# Patient Record
Sex: Male | Born: 1995 | Race: White | Hispanic: No | Marital: Married | Smoking: Current every day smoker
Health system: Southern US, Community
[De-identification: ages and names within clinical notes are randomized; demographics above are authoritative.]

## PROBLEM LIST (undated history)

## (undated) DIAGNOSIS — M25569 Pain in unspecified knee: Secondary | ICD-10-CM

## (undated) DIAGNOSIS — B009 Herpesviral infection, unspecified: Secondary | ICD-10-CM

## (undated) DIAGNOSIS — F419 Anxiety disorder, unspecified: Secondary | ICD-10-CM

## (undated) HISTORY — DX: Herpesviral infection, unspecified: B00.9

---
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
## 2016-01-25 ENCOUNTER — Other Ambulatory Visit (HOSPITAL_COMMUNITY): Payer: Self-pay | Admitting: Ophthalmology

## 2016-01-25 DIAGNOSIS — M2391 Unspecified internal derangement of right knee: Secondary | ICD-10-CM

## 2016-01-29 ENCOUNTER — Ambulatory Visit (HOSPITAL_COMMUNITY)
Admission: RE | Admit: 2016-01-29 | Discharge: 2016-01-29 | Disposition: A | Discharge status: Home / Self Care | Payer: 59 | Source: Ambulatory Visit | Attending: Ophthalmology | Admitting: Ophthalmology

## 2016-01-29 DIAGNOSIS — M2391 Unspecified internal derangement of right knee: Secondary | ICD-10-CM

## 2016-06-02 ENCOUNTER — Encounter (HOSPITAL_COMMUNITY): Payer: Self-pay

## 2016-06-02 ENCOUNTER — Emergency Department (HOSPITAL_COMMUNITY)
Admission: EM | Admit: 2016-06-02 | Discharge: 2016-06-02 | Disposition: A | Discharge status: Home / Self Care | Payer: 59 | Attending: Emergency Medicine | Admitting: Emergency Medicine

## 2016-06-02 ENCOUNTER — Emergency Department (HOSPITAL_COMMUNITY): Payer: 59

## 2016-06-02 DIAGNOSIS — Y939 Activity, unspecified: Secondary | ICD-10-CM | POA: Insufficient documentation

## 2016-06-02 DIAGNOSIS — Y999 Unspecified external cause status: Secondary | ICD-10-CM | POA: Diagnosis not present

## 2016-06-02 DIAGNOSIS — S63501A Unspecified sprain of right wrist, initial encounter: Secondary | ICD-10-CM | POA: Diagnosis not present

## 2016-06-02 DIAGNOSIS — Y929 Unspecified place or not applicable: Secondary | ICD-10-CM | POA: Diagnosis not present

## 2016-06-02 DIAGNOSIS — W000XXA Fall on same level due to ice and snow, initial encounter: Secondary | ICD-10-CM | POA: Insufficient documentation

## 2016-06-02 DIAGNOSIS — S6991XA Unspecified injury of right wrist, hand and finger(s), initial encounter: Secondary | ICD-10-CM | POA: Diagnosis present

## 2016-06-02 MED ORDER — HYDROCODONE-ACETAMINOPHEN 5-325 MG PO TABS
1.0000 | ORAL_TABLET | Freq: Once | ORAL | Status: AC
  Administered 2016-06-02: 1 via ORAL
  Filled 2016-06-02: qty 1

## 2016-06-02 NOTE — ED Triage Notes (Signed)
Patient fell on ice this am, and caught himself withhis right hand. Swelling and pain since. Reports difficulty moving right thumb with discomfort of right thumb and index finger. Sensation intact

## 2016-06-02 NOTE — Discharge Instructions (Signed)
Elevate and apply ice packs on/off to your wrist.  Follow-up with the orthopedic doctor listed in one week if not improving.  Take ibuprofen 600 mg 3 times a day.

## 2016-06-02 NOTE — ED Provider Notes (Signed)
[facility] Provider Note   CSN: [id number] Arrival date & time: 06/02/16  2114     History   Chief Complaint Chief Complaint  Patient presents with  . Fall    HPI Gregory Fleming is a 21 y.o. male.  HPI   Gregory Fleming is a 21 y.o. male who presents to the Emergency Department complaining of Right thumb and wrist pain secondary to a fall that occurred earlier today. He states he slipped on some ice and fell on his right hand. He reports some swelling of his proximal thumb. Pain worse with movement of the thumb and right index finger. He is taking Tylenol and ibuprofen earlier today with out significant relief. He denies open wound, numbness of the hand or fingers, or pain proximal to the wrist. He denies other injuries as well.  Past Medical History:  Diagnosis Date  . Anxiety   . Knee pain     There are no active problems to display for this patient.   History reviewed. No pertinent surgical history.     Home Medications    Prior to Admission medications   Medication Sig Start Date End Date Taking? Authorizing Provider  ibuprofen (ADVIL,MOTRIN) 600 MG tablet Take 1 tablet (600 mg total) by mouth every 6 (six) hours as needed. 12/05/15   Ivery Quale, PA-C  ondansetron (ZOFRAN-ODT) 8 MG disintegrating tablet Take 1 tablet (8 mg total) by mouth every 8 (eight) hours as needed for nausea or vomiting. 08/06/14   Benjiman Core, MD  traMADol (ULTRAM) 50 MG tablet Take 1 tablet (50 mg total) by mouth every 6 (six) hours as needed. 12/05/15   Ivery Quale, PA-C    Family History History reviewed. No pertinent family history.  Social History Social History  Substance Use Topics  . Smoking status: Never Smoker  . Smokeless tobacco: Never Used  . Alcohol use No     Allergies   Patient has no known allergies.   Review of Systems Review of Systems  Constitutional: Negative for chills and fever.  Musculoskeletal: Positive for arthralgias (Right  thumb, index finger and wrist pain) and joint swelling.  Skin: Negative for color change and wound.  Neurological: Negative for dizziness, weakness and numbness.  All other systems reviewed and are negative.    Physical Exam Updated Vital Signs BP 152/75 (BP Location: Right Arm)   Pulse 107   Temp 97.9 F (36.6 C) (Oral)   Resp 16   Ht 5\' 11"  (1.803 m)   Wt 122.5 kg   SpO2 97%   BMI 37.66 kg/m   Physical Exam  Constitutional: He is oriented to person, place, and time. He appears well-developed and well-nourished. No distress.  HENT:  Head: Normocephalic and atraumatic.  Cardiovascular: Normal rate, regular rhythm and normal heart sounds.   Pulmonary/Chest: Effort normal and breath sounds normal.  Musculoskeletal: He exhibits tenderness. He exhibits no edema.       Right hand: He exhibits tenderness and swelling. He exhibits no bony tenderness, normal capillary refill, no deformity and no laceration. Normal sensation noted. He exhibits no finger abduction, no thumb/finger opposition and no wrist extension trouble.       Hands: Tenderness to palpation of the right wrist, proximal right thumb and proximal right index finger. Radial pulse is brisk, distal sensation intact.  CR< 2 sec.  No bruising or bony deformity.    Neurological: He is alert and oriented to person, place, and time. He exhibits normal muscle tone. Coordination  normal.  Skin: Skin is warm and dry.  Nursing note and vitals reviewed.    ED Treatments / Results  Labs (all labs ordered are listed, but only abnormal results are displayed) Labs Reviewed - No data to display  EKG  EKG Interpretation None       Radiology Dg Wrist Complete Right  Result Date: 06/02/2016 CLINICAL DATA:  Right wrist pain along the radial side after fall on ice. EXAM: RIGHT WRIST - COMPLETE 3+ VIEW COMPARISON:  None. FINDINGS: There is no evidence of fracture or dislocation. There is ulnar minus variance with remodeled appearance  of the distal radial metaphysis at the distal radioulnar joint from resultant ulnar impingement. Carpal bones are maintained. Scaphoid appears intact. Mild soft tissue swelling about the wrist. IMPRESSION: No acute osseous abnormality. Ulnar minus variance with remodeled appearance of the distal radius attributable to ulnar impingement. Electronically Signed   By: Tollie Ethavid  Kwon M.D.   On: 06/02/2016 22:55   Dg Hand Complete Right  Result Date: 06/02/2016 CLINICAL DATA:  Right hand can't wrist pain along the radial side EXAM: RIGHT HAND - COMPLETE 3+ VIEW COMPARISON:  None. FINDINGS: There is no evidence of fracture or dislocation. There is no evidence of arthropathy or other focal bone abnormality. Soft tissues are unremarkable. IMPRESSION: Negative. Electronically Signed   By: Tollie Ethavid  Kwon M.D.   On: 06/02/2016 22:52    Procedures Procedures (including critical care time)  Medications Ordered in ED Medications  HYDROcodone-acetaminophen (NORCO/VICODIN) 5-325 MG per tablet 1 tablet (not administered)     Initial Impression / Assessment and Plan / ED Course  I have reviewed the triage vital signs and the nursing notes.  Pertinent labs & imaging results that were available during my care of the patient were reviewed by me and considered in my medical decision making (see chart for details).     Patient well-appearing. X-rays negative for fracture. Likely sprain. Remains neurovascularly intact.  Wrist splint applied, pain improved, patient agrees to treatment plan with NSAID, ice, and close orthopedic follow-up if needed.   Final Clinical Impressions(s) / ED Diagnoses   Final diagnoses:  Sprain of right wrist, initial encounter    New Prescriptions New Prescriptions   No medications on file     Rosey Bathammy Keera Altidor, PA-C 06/04/16 1251    Donnetta HutchingBrian Cook, MD 06/05/16 1129

## 2016-06-06 ENCOUNTER — Ambulatory Visit: Payer: 59 | Admitting: Orthopaedic Surgery

## 2016-11-14 ENCOUNTER — Emergency Department (HOSPITAL_COMMUNITY)
Admission: EM | Admit: 2016-11-14 | Discharge: 2016-11-15 | Disposition: A | Discharge status: Home / Self Care | Payer: No Typology Code available for payment source | Attending: Emergency Medicine | Admitting: Emergency Medicine

## 2016-11-14 ENCOUNTER — Emergency Department (HOSPITAL_COMMUNITY): Payer: No Typology Code available for payment source

## 2016-11-14 ENCOUNTER — Encounter (HOSPITAL_COMMUNITY): Payer: Self-pay | Admitting: Emergency Medicine

## 2016-11-14 DIAGNOSIS — Y999 Unspecified external cause status: Secondary | ICD-10-CM | POA: Insufficient documentation

## 2016-11-14 DIAGNOSIS — Y92410 Unspecified street and highway as the place of occurrence of the external cause: Secondary | ICD-10-CM | POA: Diagnosis not present

## 2016-11-14 DIAGNOSIS — Y939 Activity, unspecified: Secondary | ICD-10-CM | POA: Insufficient documentation

## 2016-11-14 DIAGNOSIS — R0789 Other chest pain: Secondary | ICD-10-CM | POA: Diagnosis not present

## 2016-11-14 NOTE — Discharge Instructions (Signed)
Alternate 600 mg of ibuprofen and [phone] mg of Tylenol every 3 hours as needed for pain. Do not exceed 4000 mg of Tylenol daily. Apply ice or heat to the effected area for comfort. Do some gentle stretching during hot showers or baths. Return to the ED if any concerning signs or symptoms develop such as fever, chills, shortness of breath, worsening chest pain, or altered mental status. Follow-up with her primary care physician in 1-2 weeks for reevaluation if symptoms persist.
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
## 2017-01-03 ENCOUNTER — Encounter (HOSPITAL_COMMUNITY): Payer: Self-pay | Admitting: Emergency Medicine

## 2017-01-03 ENCOUNTER — Emergency Department (HOSPITAL_COMMUNITY)
Admission: EM | Admit: 2017-01-03 | Discharge: 2017-01-03 | Disposition: A | Discharge status: Home / Self Care | Payer: Self-pay | Attending: Emergency Medicine | Admitting: Emergency Medicine

## 2017-01-03 ENCOUNTER — Emergency Department (HOSPITAL_COMMUNITY): Payer: Self-pay

## 2017-01-03 DIAGNOSIS — J329 Chronic sinusitis, unspecified: Secondary | ICD-10-CM | POA: Insufficient documentation

## 2017-01-03 DIAGNOSIS — R05 Cough: Secondary | ICD-10-CM | POA: Insufficient documentation

## 2017-01-03 DIAGNOSIS — F17210 Nicotine dependence, cigarettes, uncomplicated: Secondary | ICD-10-CM | POA: Insufficient documentation

## 2017-01-03 DIAGNOSIS — J029 Acute pharyngitis, unspecified: Secondary | ICD-10-CM | POA: Insufficient documentation

## 2017-01-03 LAB — RAPID STREP SCREEN (MED CTR MEBANE ONLY): STREPTOCOCCUS, GROUP A SCREEN (DIRECT): NEGATIVE

## 2017-01-03 MED ORDER — NAPROXEN 250 MG PO TABS
250.0000 mg | ORAL_TABLET | Freq: Two times a day (BID) | ORAL | 0 refills | Status: DC | PRN
Start: 2017-01-03 — End: 2018-11-19

## 2017-01-03 MED ORDER — ACETAMINOPHEN 325 MG PO TABS
650.0000 mg | ORAL_TABLET | Freq: Once | ORAL | Status: AC
  Administered 2017-01-03: 650 mg via ORAL
  Filled 2017-01-03: qty 2

## 2017-01-03 MED ORDER — IBUPROFEN 400 MG PO TABS
400.0000 mg | ORAL_TABLET | Freq: Once | ORAL | Status: AC
  Administered 2017-01-03: 400 mg via ORAL
  Filled 2017-01-03: qty 1

## 2017-01-03 MED ORDER — IPRATROPIUM-ALBUTEROL 0.5-2.5 (3) MG/3ML IN SOLN
3.0000 mL | Freq: Once | RESPIRATORY_TRACT | Status: AC
  Administered 2017-01-03: 3 mL via RESPIRATORY_TRACT
  Filled 2017-01-03: qty 3

## 2017-01-03 MED ORDER — AZITHROMYCIN 250 MG PO TABS
ORAL_TABLET | ORAL | 0 refills | Status: DC
Start: 2017-01-03 — End: 2018-11-19

## 2017-01-03 MED ORDER — FLUTICASONE PROPIONATE 50 MCG/ACT NA SUSP
2.0000 | Freq: Every day | NASAL | 0 refills | Status: DC
Start: 2017-01-03 — End: 2018-11-19

## 2017-01-03 NOTE — Discharge Instructions (Signed)
Take over the counter decongestant (such as sudafed), as directed on packaging, for the next week.  Use over the counter normal saline nasal spray, as instructed in the Emergency Department, several times per day for the next 2 weeks.  Take the prescriptions as directed. Call your regular medical doctor on Monday to schedule a follow up appointment this week.  Return to the Emergency Department immediately if worsening.

## 2017-01-03 NOTE — ED Notes (Signed)
Facial redness and pressure to sinus region.

## 2017-01-03 NOTE — ED Notes (Signed)
Pt upset stating that he didn't get any help while he was here. Pt made aware of all treatment that was given to him while he was here. When this RN asked pt what he felt like we didn't help him with he said, "my teeth still hurt." "They've been like this for 4 days now." This RN advised pt he needs to complete the antibiotic fully, use nasal spray, decongestant, and tylenol and/or ibuprofen for pain and fever. Pt also made aware to follow up with his PCP this week. Pt verbalized understanding as well as pt's wife.

## 2017-01-03 NOTE — ED Notes (Signed)
Alerted MD about pt.'s throbbing pain .

## 2017-01-03 NOTE — ED Provider Notes (Signed)
[facility]-EMERGENCY DEPT Provider Note   CSN: [id number] Arrival date & time: 01/03/17  2138     History   Chief Complaint Chief Complaint  Patient presents with  . Facial Pain    x3 days    HPI Gregory Fleming is a 21 y.o. male.  HPI  Pt was seen at 2155.  Per pt and his wife, c/o gradual onset and persistence of constant sore throat, runny/stuffy nose, sinus congestion, and cough for the past 3 to 4 days.  Pt states his wife was recently treated for pneumonia. Denies fevers, no rash, no CP/SOB, no N/V/D, no abd pain.    Past Medical History:  Diagnosis Date  . Anxiety   . Knee pain     There are no active problems to display for this patient.   History reviewed. No pertinent surgical history.     Home Medications    Prior to Admission medications   Not on File    Family History No family history on file.  Social History Social History  Substance Use Topics  . Smoking status: Current Every Day Smoker    Packs/day: 0.20    Types: Cigarettes  . Smokeless tobacco: Never Used  . Alcohol use No     Allergies   Patient has no known allergies.   Review of Systems Review of Systems ROS: Statement: All systems negative except as marked or noted in the HPI; Constitutional: Negative for fever and chills. ; ; Eyes: Negative for eye pain, redness and discharge. ; ; ENMT: Negative for ear pain, hoarseness, +nasal congestion, sinus pressure and sore throat. ; ; Cardiovascular: Negative for chest pain, palpitations, diaphoresis, dyspnea and peripheral edema. ; ; Respiratory: +cough. Negative for wheezing and stridor. ; ; Gastrointestinal: Negative for nausea, vomiting, diarrhea, abdominal pain, blood in stool, hematemesis, jaundice and rectal bleeding. . ; ; Genitourinary: Negative for dysuria, flank pain and hematuria. ; ; Musculoskeletal: Negative for back pain and neck pain. Negative for swelling and trauma.; ; Skin: Negative for pruritus, rash, abrasions, blisters,  bruising and skin lesion.; ; Neuro: Negative for headache, lightheadedness and neck stiffness. Negative for weakness, altered level of consciousness, altered mental status, extremity weakness, paresthesias, involuntary movement, seizure and syncope.       Physical Exam Updated Vital Signs BP (!) 155/89 (BP Location: Right Arm)   Pulse (!) 116   Temp (!) 100.8 F (38.2 C) (Tympanic)   Ht 5\' 11"  (1.803 m)   Wt 127 kg (280 lb)   SpO2 96%   BMI 39.05 kg/m   Physical Exam 2200: Physical examination:  Nursing notes reviewed; Vital signs and O2 SAT reviewed;  Constitutional: Well developed, Well nourished, Well hydrated, In no acute distress; Head:  Normocephalic, atraumatic; Eyes: EOMI, PERRL, No scleral icterus; ENMT: TM's clear bilat. +edemetous nasal turbinates bilat with clear rhinorrhea. Mouth and pharynx without lesions. No tonsillar exudates. No intra-oral edema. No submandibular or sublingual edema. No hoarse voice, no drooling, no stridor. No pain with manipulation of larynx. No trismus. Mouth and pharynx normal, Mucous membranes moist; Neck: Supple, Full range of motion, No lymphadenopathy. No meningeal signs; Cardiovascular: Regular rate and rhythm, No gallop; Respiratory: Breath sounds clear & equal bilaterally, No wheezes.  Speaking full sentences with ease, Normal respiratory effort/excursion; Chest: Nontender, Movement normal; Abdomen: Soft, Nontender, Nondistended, Normal bowel sounds; Genitourinary: No CVA tenderness; Extremities: Pulses normal, No tenderness, No edema, No calf edema or asymmetry.; Neuro: AA&Ox3, Major CN grossly intact.  Speech clear. No gross  focal motor or sensory deficits in extremities.; Skin: Color normal, Warm, Dry.   ED Treatments / Results  Labs (all labs ordered are listed, but only abnormal results are displayed)   EKG  EKG Interpretation None       Radiology   Procedures Procedures (including critical care time)  Medications Ordered in  ED Medications  ipratropium-albuterol (DUONEB) 0.5-2.5 (3) MG/3ML nebulizer solution 3 mL (3 mLs Nebulization Given 01/03/17 2213)  acetaminophen (TYLENOL) tablet 650 mg (650 mg Oral Given 01/03/17 2208)  ibuprofen (ADVIL,MOTRIN) tablet 400 mg (400 mg Oral Given 01/03/17 2210)     Initial Impression / Assessment and Plan / ED Course  I have reviewed the triage vital signs and the nursing notes.  Pertinent labs & imaging results that were available during my care of the patient were reviewed by me and considered in my medical decision making (see chart for details).  MDM Reviewed: previous chart, nursing note and vitals Interpretation: x-ray and labs   Results for orders placed or performed during the hospital encounter of 01/03/17  Rapid strep screen  Result Value Ref Range   Streptococcus, Group A Screen (Direct) NEGATIVE NEGATIVE   Dg Chest 2 View Result Date: 01/03/2017 CLINICAL DATA:  Productive cough and shortness of breath. Central chest pain. EXAM: CHEST  2 VIEW COMPARISON:  Radiographs 11/14/2016 FINDINGS: The cardiomediastinal contours are normal. Bronchial thickening. Pulmonary vasculature is normal. No consolidation, pleural effusion, or pneumothorax. No acute osseous abnormalities are seen. IMPRESSION: Bronchial thickening without pneumonia. Electronically Signed   By: Rubye Oaks M.D.   On: 01/03/2017 22:52    2300:  Workup reassuring. Dx and testing d/w pt and family.  Questions answered.  Verb understanding, agreeable to d/c home with outpt f/u.    Final Clinical Impressions(s) / ED Diagnoses   Final diagnoses:  None    New Prescriptions New Prescriptions   No medications on file     Samuel Jester, DO 01/04/17 2342

## 2017-01-03 NOTE — ED Triage Notes (Signed)
Sick x 1 week Wife released with pneumonia from hospital

## 2017-01-06 LAB — CULTURE, GROUP A STREP (THRC)

## 2018-10-21 ENCOUNTER — Ambulatory Visit (HOSPITAL_COMMUNITY)
Admission: RE | Admit: 2018-10-21 | Discharge: 2018-10-21 | Disposition: A | Discharge status: Home / Self Care | Payer: Self-pay | Source: Ambulatory Visit | Attending: Preventative Medicine | Admitting: Preventative Medicine

## 2018-10-21 ENCOUNTER — Other Ambulatory Visit (HOSPITAL_COMMUNITY): Payer: Self-pay | Admitting: Preventative Medicine

## 2018-10-21 ENCOUNTER — Other Ambulatory Visit: Payer: Self-pay | Admitting: Preventative Medicine

## 2018-10-21 ENCOUNTER — Other Ambulatory Visit: Payer: Self-pay

## 2018-10-21 DIAGNOSIS — K429 Umbilical hernia without obstruction or gangrene: Secondary | ICD-10-CM

## 2018-10-21 MED ORDER — IOHEXOL 300 MG/ML  SOLN
100.0000 mL | Freq: Once | INTRAMUSCULAR | Status: AC | PRN
  Administered 2018-10-21: 16:00:00 100 mL via INTRAVENOUS

## 2018-10-21 MED ORDER — IOHEXOL 300 MG/ML  SOLN
30.0000 mL | Freq: Once | INTRAMUSCULAR | Status: AC | PRN
  Administered 2018-10-21: 16:00:00 30 mL via ORAL

## 2018-11-18 ENCOUNTER — Emergency Department (HOSPITAL_COMMUNITY)
Admission: EM | Admit: 2018-11-18 | Discharge: 2018-11-18 | Disposition: A | Discharge status: Home / Self Care | Payer: Self-pay | Attending: Emergency Medicine | Admitting: Emergency Medicine

## 2018-11-18 ENCOUNTER — Other Ambulatory Visit: Payer: Self-pay

## 2018-11-18 ENCOUNTER — Encounter (HOSPITAL_COMMUNITY): Payer: Self-pay | Admitting: Emergency Medicine

## 2018-11-18 DIAGNOSIS — K429 Umbilical hernia without obstruction or gangrene: Secondary | ICD-10-CM | POA: Insufficient documentation

## 2018-11-18 DIAGNOSIS — F17210 Nicotine dependence, cigarettes, uncomplicated: Secondary | ICD-10-CM | POA: Insufficient documentation

## 2018-11-18 NOTE — ED Triage Notes (Signed)
Pt saw urgent care three weeks ago and sent here for CT scan scan, dx with umbilical hernia and told to f/u with surgeon, pt says pain went away on its own then came back two days ago and he is here with intention of getting a referral to surgeon.

## 2018-11-18 NOTE — ED Provider Notes (Signed)
[facility] EMERGENCY DEPARTMENT Provider Note   CSN: [id number] Arrival date & time: 11/18/18  1909     History   Chief Complaint Chief Complaint  Patient presents with  . Umbilical Hernia    HPI Gregory Fleming is a 23 y.o. male.     Persistent periumbilical pain for greater than 1 month.  s/p CT scan on 10/21/2018 which revealed a fat-containing umbilical hernia.  He is eating without vomiting.  Review of systems positive for diarrhea.  Pain is affecting his ability to do his job.  Has not yet seen a Education officer, environmental.  Nothing makes symptoms better or worse.     Past Medical History:  Diagnosis Date  . Anxiety   . Knee pain     There are no active problems to display for this patient.   History reviewed. No pertinent surgical history.      Home Medications    Prior to Admission medications   Medication Sig Start Date End Date Taking? Authorizing Provider  azithromycin (ZITHROMAX) 250 MG tablet Take 2 tablets PO day 1, then 1 tab PO daily x4 days. 01/03/17   Francine Graven, DO  fluticasone (FLONASE) 50 MCG/ACT nasal spray Place 2 sprays into both nostrils daily. For the next 2 weeks 01/03/17   Francine Graven, DO  naproxen (NAPROSYN) 250 MG tablet Take 1 tablet (250 mg total) by mouth 2 (two) times daily as needed for mild pain or moderate pain (take with food). 01/03/17   Francine Graven, DO    Family History No family history on file.  Social History Social History   Tobacco Use  . Smoking status: Current Every Day Smoker    Packs/day: 0.20    Types: Cigarettes  . Smokeless tobacco: Never Used  Substance Use Topics  . Alcohol use: Yes    Comment: occ  . Drug use: No     Allergies   Patient has no known allergies.   Review of Systems Review of Systems  All other systems reviewed and are negative.    Physical Exam Updated Vital Signs BP 134/83 (BP Location: Right Arm)   Pulse 94   Temp 98.7 F (37.1 C) (Oral)   Resp 17   Ht 5\' 7"   (1.702 m)   Wt 122.5 kg   SpO2 97%   BMI 42.29 kg/m   Physical Exam Vitals signs and nursing note reviewed.  Constitutional:      Appearance: He is well-developed.  HENT:     Head: Normocephalic and atraumatic.  Eyes:     Conjunctiva/sclera: Conjunctivae normal.  Neck:     Musculoskeletal: Neck supple.  Cardiovascular:     Rate and Rhythm: Normal rate and regular rhythm.  Pulmonary:     Effort: Pulmonary effort is normal.     Breath sounds: Normal breath sounds.  Abdominal:     General: Bowel sounds are normal.     Palpations: Abdomen is soft.     Comments: Most tender inferior and to the right and left in the umbilicus.  No flagrant bulging hernia noted.  Musculoskeletal: Normal range of motion.  Skin:    General: Skin is warm and dry.  Neurological:     Mental Status: He is alert and oriented to person, place, and time.  Psychiatric:        Behavior: Behavior normal.      ED Treatments / Results  Labs (all labs ordered are listed, but only abnormal results are displayed) Labs Reviewed - No  data to display  EKG None  Radiology No results found.  Procedures Procedures (including critical care time)  Medications Ordered in ED Medications - No data to display   Initial Impression / Assessment and Plan / ED Course  I have reviewed the triage vital signs and the nursing notes.  Pertinent labs & imaging results that were available during my care of the patient were reviewed by me and considered in my medical decision making (see chart for details).        History and physical consistent with periumbilical hernia.  Discussed with Dr. Franky MachoMark Jenkins.  He will see patient tomorrow in the office.  Final Clinical Impressions(s) / ED Diagnoses   Final diagnoses:  Periumbilical hernia    ED Discharge Orders    None       Donnetta Hutchingook, Keiry Kowal, MD 11/18/18 2039

## 2018-11-18 NOTE — Discharge Instructions (Signed)
I spoke with the general surgeon Dr. Aviva Signs.  He will see you tomorrow at 130 in his office.  Address given in your discharge instructions

## 2018-11-19 ENCOUNTER — Ambulatory Visit (INDEPENDENT_AMBULATORY_CARE_PROVIDER_SITE_OTHER): Payer: Self-pay | Admitting: General Surgery

## 2018-11-19 ENCOUNTER — Encounter: Payer: Self-pay | Admitting: General Surgery

## 2018-11-19 VITALS — BP 127/78 | HR 87 | Temp 97.5°F | Resp 16 | Ht 71.0 in | Wt 283.0 lb

## 2018-11-19 DIAGNOSIS — K429 Umbilical hernia without obstruction or gangrene: Secondary | ICD-10-CM

## 2018-11-19 NOTE — Progress Notes (Signed)
Gregory Fleming; [id number]; 07/03/1995   HPI Patient is a 23-year-old white male who was referred to my care by the emergency room for evaluation treatment of an umbilical hernia.  He was seen yesterday in the emergency room.  He had been seen earlier in the month and a CT scan of the abdomen revealed a small umbilical hernia.  He has never had abdominal surgery.  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  He denies any nausea or vomiting.  He denies any fever or chills.  He currently has a pain level of 5 out of 10.  His abdominal pain usually is periumbilical and gives him an upset stomach.  He occasionally has diarrhea with this. Past Medical History:  Diagnosis Date  . Anxiety   . Knee pain     History reviewed. No pertinent surgical history.  History reviewed. No pertinent family history.  Current Outpatient Medications on File Prior to Visit  Medication Sig Dispense Refill  . azithromycin (ZITHROMAX) 250 MG tablet Take 2 tablets PO day 1, then 1 tab PO daily x4 days. (Patient not taking: Reported on 11/19/2018) 6 tablet 0  . fluticasone (FLONASE) 50 MCG/ACT nasal spray Place 2 sprays into both nostrils daily. For the next 2 weeks 16 g 0  . naproxen (NAPROSYN) 250 MG tablet Take 1 tablet (250 mg total) by mouth 2 (two) times daily as needed for mild pain or moderate pain (take with food). 14 tablet 0   No current facility-administered medications on file prior to visit.     No Known Allergies  Social History   Substance and Sexual Activity  Alcohol Use Yes   Comment: occ    Social History   Tobacco Use  Smoking Status Current Every Day Smoker  . Packs/day: 0.20  . Types: Cigarettes  Smokeless Tobacco Never Used    Review of Systems  Constitutional: Negative.   HENT: Negative.   Eyes: Negative.   Respiratory: Negative.   Cardiovascular: Negative.   Gastrointestinal: Positive for abdominal pain and nausea.   Genitourinary: Negative.   Musculoskeletal: Negative.   Skin: Negative.   Neurological: Negative.   Endo/Heme/Allergies: Negative.   Psychiatric/Behavioral: Negative.     Objective   Vitals:   11/19/18 1400  BP: 127/78  Pulse: 87  Resp: 16  Temp: (!) 97.5 F (36.4 C)  SpO2: 96%    Physical Exam Vitals signs reviewed.  Constitutional:      Appearance: Normal appearance. He is obese. He is not ill-appearing.  HENT:     Head: Normocephalic and atraumatic.  Cardiovascular:     Rate and Rhythm: Normal rate and regular rhythm.     Heart sounds: Normal heart sounds. No murmur. No friction rub. No gallop.   Pulmonary:     Effort: Pulmonary effort is normal. No respiratory distress.     Breath sounds: Normal breath sounds. No stridor. No wheezing, rhonchi or rales.  Abdominal:     General: Bowel sounds are normal. There is no distension.     Palpations: Abdomen is soft. There is no mass.     Tenderness: There is no abdominal tenderness. There is no guarding or rebound.     Hernia: A hernia is present.     Comments: Small reducible umbilical hernia, less than 1 cm in diameter  Skin:    General: Skin is warm and dry.  Neurological:     General: No focal deficit present.       Mental Status: He is alert and oriented to person, place, and time.   CT scan report reviewed.  ER notes reviewed.  Assessment  Umbilical hernia Plan   As patient is having increasing symptoms, he would like to proceed with an umbilical herniorrhaphy.  This will be performed on 11/30/2018.  The risks and benefits of the procedure including bleeding, infection, possible mesh use, and the possibility of recurrence of the hernia were fully explained to the patient, who gave informed consent.  

## 2018-11-19 NOTE — H&P (Signed)
Gregory Fleming; [id number]; 08/09/1995   HPI Patient is a 23 year old white male who was referred to my care by the emergency room for evaluation treatment of an umbilical hernia.  He was seen yesterday in the emergency room.  He had been seen earlier in the month and a CT scan of the abdomen revealed a small umbilical hernia.  He has never had abdominal surgery.  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  He denies any nausea or vomiting.  He denies any fever or chills.  He currently has a pain level of 5 out of 10.  His abdominal pain usually is periumbilical and gives him an upset stomach.  He occasionally has diarrhea with this. Past Medical History:  Diagnosis Date  . Anxiety   . Knee pain     History reviewed. No pertinent surgical history.  History reviewed. No pertinent family history.  Current Outpatient Medications on File Prior to Visit  Medication Sig Dispense Refill  . azithromycin (ZITHROMAX) 250 MG tablet Take 2 tablets PO day 1, then 1 tab PO daily x4 days. (Patient not taking: Reported on 11/19/2018) 6 tablet 0  . fluticasone (FLONASE) 50 MCG/ACT nasal spray Place 2 sprays into both nostrils daily. For the next 2 weeks 16 g 0  . naproxen (NAPROSYN) 250 MG tablet Take 1 tablet (250 mg total) by mouth 2 (two) times daily as needed for mild pain or moderate pain (take with food). 14 tablet 0   No current facility-administered medications on file prior to visit.     No Known Allergies  Social History   Substance and Sexual Activity  Alcohol Use Yes   Comment: occ    Social History   Tobacco Use  Smoking Status Current Every Day Smoker  . Packs/day: 0.20  . Types: Cigarettes  Smokeless Tobacco Never Used    Review of Systems  Constitutional: Negative.   HENT: Negative.   Eyes: Negative.   Respiratory: Negative.   Cardiovascular: Negative.   Gastrointestinal: Positive for abdominal pain and nausea.   Genitourinary: Negative.   Musculoskeletal: Negative.   Skin: Negative.   Neurological: Negative.   Endo/Heme/Allergies: Negative.   Psychiatric/Behavioral: Negative.     Objective   Vitals:   11/19/18 1400  BP: 127/78  Pulse: 87  Resp: 16  Temp: (!) 97.5 F (36.4 C)  SpO2: 96%    Physical Exam Vitals signs reviewed.  Constitutional:      Appearance: Normal appearance. He is obese. He is not ill-appearing.  HENT:     Head: Normocephalic and atraumatic.  Cardiovascular:     Rate and Rhythm: Normal rate and regular rhythm.     Heart sounds: Normal heart sounds. No murmur. No friction rub. No gallop.   Pulmonary:     Effort: Pulmonary effort is normal. No respiratory distress.     Breath sounds: Normal breath sounds. No stridor. No wheezing, rhonchi or rales.  Abdominal:     General: Bowel sounds are normal. There is no distension.     Palpations: Abdomen is soft. There is no mass.     Tenderness: There is no abdominal tenderness. There is no guarding or rebound.     Hernia: A hernia is present.     Comments: Small reducible umbilical hernia, less than 1 cm in diameter  Skin:    General: Skin is warm and dry.  Neurological:     General: No focal deficit present.  Mental Status: He is alert and oriented to person, place, and time.   CT scan report reviewed.  ER notes reviewed.  Assessment  Umbilical hernia Plan   As patient is having increasing symptoms, he would like to proceed with an umbilical herniorrhaphy.  This will be performed on 11/30/2018.  The risks and benefits of the procedure including bleeding, infection, possible mesh use, and the possibility of recurrence of the hernia were fully explained to the patient, who gave informed consent.

## 2018-11-19 NOTE — Patient Instructions (Signed)
Ventral Hernia  A ventral hernia is a bulge of tissue from inside the abdomen that pushes through a weak area of the muscles that form the front wall of the abdomen. The tissues inside the abdomen are inside a sac (peritoneum). These tissues include the small intestine, large intestine, and the fatty tissue that covers the intestines (omentum). Sometimes, the bulge that forms a hernia contains intestines. Other hernias contain only fat. Ventral hernias do not go away without surgical treatment. There are several types of ventral hernias. You may have:  A hernia at an incision site from previous abdominal surgery (incisional hernia).  A hernia just above the belly button (epigastric hernia), or at the belly button (umbilical hernia). These types of hernias can develop from heavy lifting or straining.  A hernia that comes and goes (reducible hernia). It may be visible only when you lift or strain. This type of hernia can be pushed back into the abdomen (reduced).  A hernia that traps abdominal tissue inside the hernia (incarcerated hernia). This type of hernia does not reduce.  A hernia that cuts off blood flow to the tissues inside the hernia (strangulated hernia). The tissues can start to die if this happens. This is a very painful bulge that cannot be reduced. A strangulated hernia is a medical emergency. What are the causes? This condition is caused by abdominal tissue putting pressure on an area of weakness in the abdominal muscles. What increases the risk? The following factors may make you more likely to develop this condition:  Being male.  Being 60 or older.  Being overweight or obese.  Having had previous abdominal surgery, especially if there was an infection after surgery.  Having had an injury to the abdominal wall.  Having had several pregnancies.  Having a buildup of fluid inside the abdomen (ascites). What are the signs or symptoms? The only symptom of a ventral hernia  may be a painless bulge in the abdomen. A reducible hernia may be visible only when you strain, cough, or lift. Other symptoms may include:  Dull pain.  A feeling of pressure. Signs and symptoms of a strangulated hernia may include:  Increasing pain.  Nausea and vomiting.  Pain when pressing on the hernia.  The skin over the hernia turning red or purple.  Constipation.  Blood in the stool (feces). How is this diagnosed? This condition may be diagnosed based on:  Your symptoms.  Your medical history.  A physical exam. You may be asked to cough or strain while standing. These actions increase the pressure inside your abdomen and force the hernia through the opening in your muscles. Your health care provider may try to reduce the hernia by pressing on it.  Imaging studies, such as an ultrasound or CT scan. How is this treated? This condition is treated with surgery. If you have a strangulated hernia, surgery is done as soon as possible. If your hernia is small and not incarcerated, you may be asked to lose some weight before surgery. Follow these instructions at home:  Follow instructions from your health care provider about eating or drinking restrictions.  If you are overweight, your health care provider may recommend that you increase your activity level and eat a healthier diet.  Do not lift anything that is heavier than 10 lb (4.5 kg).  Return to your normal activities as told by your health care provider. Ask your health care provider what activities are safe for you. You may need to avoid activities   that increase pressure on your hernia.  Take over-the-counter and prescription medicines only as told by your health care provider.  Keep all follow-up visits as told by your health care provider. This is important. Contact a health care provider if:  Your hernia gets larger.  Your hernia becomes painful. Get help right away if:  Your hernia becomes increasingly  painful.  You have pain along with any of the following: ? Changes in skin color in the area of the hernia. ? Nausea. ? Vomiting. ? Fever. Summary  A ventral hernia is a bulge of tissue from inside the abdomen that pushes through a weak area of the muscles that form the front wall of the abdomen.  This condition is treated with surgery, which may be urgent depending on your hernia.  Do not lift anything that is heavier than 10 lb (4.5 kg), and follow activity instructions from your health care provider. This information is not intended to replace advice given to you by your health care provider. Make sure you discuss any questions you have with your health care provider. Document Released: 04/15/2012 Document Revised: 06/11/2017 Document Reviewed: 11/18/2016 Elsevier Patient Education  2020 [employer]  

## 2018-11-23 ENCOUNTER — Telehealth: Payer: Self-pay

## 2018-11-23 NOTE — Telephone Encounter (Signed)
Patient called requesting pain meds for increased pain, MD notified.

## 2018-11-26 ENCOUNTER — Encounter (HOSPITAL_COMMUNITY): Payer: Self-pay

## 2018-11-26 ENCOUNTER — Other Ambulatory Visit (HOSPITAL_COMMUNITY)
Admission: RE | Admit: 2018-11-26 | Discharge: 2018-11-26 | Disposition: A | Discharge status: Home / Self Care | Payer: HRSA Program | Source: Ambulatory Visit | Attending: General Surgery | Admitting: General Surgery

## 2018-11-26 ENCOUNTER — Encounter (HOSPITAL_COMMUNITY)
Admission: RE | Admit: 2018-11-26 | Discharge: 2018-11-26 | Disposition: A | Discharge status: Home / Self Care | Payer: Self-pay | Source: Ambulatory Visit | Attending: General Surgery | Admitting: General Surgery

## 2018-11-26 ENCOUNTER — Other Ambulatory Visit: Payer: Self-pay

## 2018-11-26 DIAGNOSIS — Z1159 Encounter for screening for other viral diseases: Secondary | ICD-10-CM | POA: Insufficient documentation

## 2018-11-26 LAB — SARS CORONAVIRUS 2 (TAT 6-24 HRS): SARS Coronavirus 2: NEGATIVE

## 2018-11-26 NOTE — Patient Instructions (Signed)
Gregory Fleming  11/26/2018     @   Your procedure is scheduled on Monday, 11/30/18.  Report to [facility] at [phone] A.M.  Call this number if you have problems the morning of surgery:  [phone]   Remember:  Do not eat or drink after midnight.      Take these medicines the morning of surgery with A SIP OF WATER zyrtec, and xanax if needed    Do not wear jewelry, make-up or nail polish.  Do not wear lotions, powders, or perfumes, or deodorant.  Do not shave 48 hours prior to surgery.  Men may shave face and neck.  Do not bring valuables to the hospital.  [facility] is not responsible for any belongings or valuables.  Contacts, dentures or bridgework may not be worn into surgery.  Leave your suitcase in the car.  After surgery it may be brought to your room.  For patients admitted to the hospital, discharge time will be determined by your treatment team.  Patients discharged the day of surgery will not be allowed to drive home.   Name and phone number of your driver:   mom Special instructions:  none  Please read over the following fact sheets that you were given. Pain Booklet, Coughing and Deep Breathing, Surgical Site Infection Prevention, Anesthesia Post-op Instructions and Care and Recovery After Surgery   Umbilical Hernia, Adult  A hernia is a bulge of tissue that pushes through an opening between muscles. An umbilical hernia happens in the abdomen, near the belly button (umbilicus). The hernia may contain tissues from the small intestine, large intestine, or fatty tissue covering the intestines (omentum). Umbilical hernias in adults tend to get worse over time, and they require surgical treatment. There are several types of umbilical hernias. You may have:  A hernia located just above or below the umbilicus (indirect hernia). This is the most common type of umbilical hernia in adults.  A hernia that forms through an opening formed by the  umbilicus (direct hernia).  A hernia that comes and goes (reducible hernia). A reducible hernia may be visible only when you strain, lift something heavy, or cough. This type of hernia can be pushed back into the abdomen (reduced).  A hernia that traps abdominal tissue inside the hernia (incarcerated hernia). This type of hernia cannot be reduced.  A hernia that cuts off blood flow to the tissues inside the hernia (strangulated hernia). The tissues can start to die if this happens. This type of hernia requires emergency treatment. What are the causes? An umbilical hernia happens when tissue inside the abdomen presses on a weak area of the abdominal muscles. What increases the risk? You may have a greater risk of this condition if you:  Are obese.  Have had several pregnancies.  Have a buildup of fluid inside your abdomen (ascites).  Have had surgery that weakens the abdominal muscles. What are the signs or symptoms? The main symptom of this condition is a painless bulge at or near the belly button. A reducible hernia may be visible only when you strain, lift something heavy, or cough. Other symptoms may include:  Dull pain.  A feeling of pressure. Symptoms of a strangulated hernia may include:  Pain that gets increasingly worse.  Nausea and vomiting.  Pain when pressing on the hernia.  Skin over the hernia becoming red or purple.  Constipation.  Blood in the stool. How is this diagnosed? This condition may be diagnosed based  on:  A physical exam. You may be asked to cough or strain while standing. These actions increase the pressure inside your abdomen and force the hernia through the opening in your muscles. Your health care provider may try to reduce the hernia by pressing on it.  Your symptoms and medical history. How is this treated? Surgery is the only treatment for an umbilical hernia. Surgery for a strangulated hernia is done as soon as possible. If you have a  small hernia that is not incarcerated, you may need to lose weight before having surgery. Follow these instructions at home:  Lose weight, if told by your health care provider.  Do not try to push the hernia back in.  Watch your hernia for any changes in color or size. Tell your health care provider if any changes occur.  You may need to avoid activities that increase pressure on your hernia.  Do not lift anything that is heavier than 10 lb (4.5 kg) until your health care provider says that this is safe.  Take over-the-counter and prescription medicines only as told by your health care provider.  Keep all follow-up visits as told by your health care provider. This is important. Contact a health care provider if:  Your hernia gets larger.  Your hernia becomes painful. Get help right away if:  You develop sudden, severe pain near the area of your hernia.  You have pain as well as nausea or vomiting.  You have pain and the skin over your hernia changes color.  You develop a fever. This information is not intended to replace advice given to you by your health care provider. Make sure you discuss any questions you have with your health care provider. Document Released: 09/29/2015 Document Revised: 06/11/2017 Document Reviewed: 10/28/2016 Elsevier Patient Education  2020 [employer]   General Anesthesia, Adult General anesthesia is the use of medicines to make a person "go to sleep" (unconscious) for a medical procedure. General anesthesia must be used for certain procedures, and is often recommended for procedures that:  Last a long time.  Require you to be still or in an unusual position.  Are major and can cause blood loss. The medicines used for general anesthesia are called general anesthetics. As well as making you unconscious for a certain amount of time, these medicines:  Prevent pain.  Control your blood pressure.  Relax your muscles. Tell a health care  provider about:  Any allergies you have.  All medicines you are taking, including vitamins, herbs, eye drops, creams, and over-the-counter medicines.  Any problems you or family members have had with anesthetic medicines.  Types of anesthetics you have had in the past.  Any blood disorders you have.  Any surgeries you have had.  Any medical conditions you have.  Any recent upper respiratory, chest, or ear infections.  Any history of: ? Heart or lung conditions, such as heart failure, sleep apnea, asthma, or chronic obstructive pulmonary disease (COPD). ? Financial plannerMilitary service. ? Depression or anxiety.  Any tobacco or drug use, including marijuana or alcohol use.  Whether you are pregnant or may be pregnant. What are the risks? Generally, this is a safe procedure. However, problems may occur, including:  Allergic reaction.  Lung and heart problems.  Inhaling food or liquid from the stomach into the lungs (aspiration).  Nerve injury.  Dental injury.  Air in the bloodstream, which can lead to stroke.  Extreme agitation or confusion (delirium) when you wake up from the anesthetic.  Waking up during your procedure and being unable to move. This is rare. These problems are more likely to develop if you are having a major surgery or if you have an advanced or serious medical condition. You can prevent some of these complications by answering all of your health care provider's questions thoroughly and by following all instructions before your procedure. General anesthesia can cause side effects, including:  Nausea or vomiting.  A sore throat from the breathing tube.  Hoarseness.  Wheezing or coughing.  Shaking chills.  Tiredness.  Body aches.  Anxiety.  Sleepiness or drowsiness.  Confusion or agitation. What happens before the procedure? Staying hydrated Follow instructions from your health care provider about hydration, which may include:  Up to 2 hours  before the procedure - you may continue to drink clear liquids, such as water, clear fruit juice, black coffee, and plain tea.  Eating and drinking restrictions Follow instructions from your health care provider about eating and drinking, which may include:  8 hours before the procedure - stop eating heavy meals or foods such as meat, fried foods, or fatty foods.  6 hours before the procedure - stop eating light meals or foods, such as toast or cereal.  6 hours before the procedure - stop drinking milk or drinks that contain milk.  2 hours before the procedure - stop drinking clear liquids. Medicines Ask your health care provider about:  Changing or stopping your regular medicines. This is especially important if you are taking diabetes medicines or blood thinners.  Taking medicines such as aspirin and ibuprofen. These medicines can thin your blood. Do not take these medicines unless your health care provider tells you to take them.  Taking over-the-counter medicines, vitamins, herbs, and supplements. Do not take these during the week before your procedure unless your health care provider approves them. General instructions  Starting 3-6 weeks before the procedure, do not use any products that contain nicotine or tobacco, such as cigarettes and e-cigarettes. If you need help quitting, ask your health care provider.  If you brush your teeth on the morning of the procedure, make sure to spit out all of the toothpaste.  Tell your health care provider if you become ill or develop a cold, cough, or fever.  If instructed by your health care provider, bring your sleep apnea device with you on the day of your surgery (if applicable).  Ask your health care provider if you will be going home the same day, the following day, or after a longer hospital stay. ? Plan to have someone take you home from the hospital or clinic. ? Plan to have a responsible adult care for you for at least 24 hours  after you leave the hospital or clinic. This is important. What happens during the procedure?   You will be given anesthetics through both of the following: ? A mask placed over your nose and mouth. ? An IV in one of your veins.  You may receive a medicine to help you relax (sedative).  After you are unconscious, a breathing tube may be inserted down your throat to help you breathe. This will be removed before you wake up.  An anesthesia specialist will stay with you throughout your procedure. He or she will: ? Keep you comfortable and safe by continuing to give you medicines and adjusting the amount of medicine that you get. ? Monitor your blood pressure, pulse, and oxygen levels to make sure that the anesthetics do not cause  any problems. The procedure may vary among health care providers and hospitals. What happens after the procedure?  Your blood pressure, temperature, heart rate, breathing rate, and blood oxygen level will be monitored until the medicines you were given have worn off.  You will wake up in a recovery area. You may wake up slowly.  If you feel anxious or agitated, you may be given medicine to help you calm down.  If you will be going home the same day, your health care provider may check to make sure you can walk, drink, and urinate.  Your health care provider will treat any pain or side effects you have before you go home.  Do not drive for 24 hours if you were given a sedative. Summary  General anesthesia is used to keep you still and prevent pain during a procedure.  It is important to tell your health care provider about your medical history and any surgeries you have had, and previous experience with anesthesia.  Follow your health care provider's instructions about when to stop eating, drinking, or taking certain medicines before your procedure.  Plan to have someone take you home from the hospital or clinic. This information is not intended to replace  advice given to you by your health care provider. Make sure you discuss any questions you have with your health care provider. Document Released: 08/06/2007 Document Revised: 09/16/2017 Document Reviewed: 12/13/2016 Elsevier Patient Education  2020 Elsevier Inc.  General Anesthesia, Adult, Care After This sheet gives you information about how to care for yourself after your procedure. Your health care provider may also give you more specific instructions. If you have problems or questions, contact your health care provider. What can I expect after the procedure? After the procedure, the following side effects are common:  Pain or discomfort at the IV site.  Nausea.  Vomiting.  Sore throat.  Trouble concentrating.  Feeling cold or chills.  Weak or tired.  Sleepiness and fatigue.  Soreness and body aches. These side effects can affect parts of the body that were not involved in surgery. Follow these instructions at home:  For at least 24 hours after the procedure:  Have a responsible adult stay with you. It is important to have someone help care for you until you are awake and alert.  Rest as needed.  Do not: ? Participate in activities in which you could fall or become injured. ? Drive. ? Use heavy machinery. ? Drink alcohol. ? Take sleeping pills or medicines that cause drowsiness. ? Make important decisions or sign legal documents. ? Take care of children on your own. Eating and drinking  Follow any instructions from your health care provider about eating or drinking restrictions.  When you feel hungry, start by eating small amounts of foods that are soft and easy to digest (bland), such as toast. Gradually return to your regular diet.  Drink enough fluid to keep your urine pale yellow.  If you vomit, rehydrate by drinking water, juice, or clear broth. General instructions  If you have sleep apnea, surgery and certain medicines can increase your risk for  breathing problems. Follow instructions from your health care provider about wearing your sleep device: ? Anytime you are sleeping, including during daytime naps. ? While taking prescription pain medicines, sleeping medicines, or medicines that make you drowsy.  Return to your normal activities as told by your health care provider. Ask your health care provider what activities are safe for you.  Take over-the-counter and  prescription medicines only as told by your health care provider.  If you smoke, do not smoke without supervision.  Keep all follow-up visits as told by your health care provider. This is important. Contact a health care provider if:  You have nausea or vomiting that does not get better with medicine.  You cannot eat or drink without vomiting.  You have pain that does not get better with medicine.  You are unable to pass urine.  You develop a skin rash.  You have a fever.  You have redness around your IV site that gets worse. Get help right away if:  You have difficulty breathing.  You have chest pain.  You have blood in your urine or stool, or you vomit blood. Summary  After the procedure, it is common to have a sore throat or nausea. It is also common to feel tired.  Have a responsible adult stay with you for the first 24 hours after general anesthesia. It is important to have someone help care for you until you are awake and alert.  When you feel hungry, start by eating small amounts of foods that are soft and easy to digest (bland), such as toast. Gradually return to your regular diet.  Drink enough fluid to keep your urine pale yellow.  Return to your normal activities as told by your health care provider. Ask your health care provider what activities are safe for you. This information is not intended to replace advice given to you by your health care provider. Make sure you discuss any questions you have with your health care provider. Document  Released: 08/05/2000 Document Revised: 05/02/2017 Document Reviewed: 12/13/2016 Elsevier Patient Education  2020 Else[employer] Inc.  Laparoscopic Ventral Hernia Repair, Care After This sheet gives you information about how to care for yourself after your procedure. Your health care provider may also give you more specific instructions. If you have problems or questions, contact your health care provider. What can I expect after the procedure? After the procedure, it is common to have:  Pain, discomfort, or soreness. Follow these instructions at home: Incision care   Follow instructions from your health care provider about how to take care of your incision. Make sure you: ? Wash your hands with soap and water before you change your bandage (dressing) or before you touch your abdomen. If soap and water are not available, use hand sanitizer. ? Change your dressing as told by your health care provider. ? Leave stitches (sutures), skin glue, or adhesive strips in place. These skin closures may need to stay in place for 2 weeks or longer. If adhesive strip edges start to loosen and curl up, you may trim the loose edges. Do not remove adhesive strips completely unless your health care provider tells you to do that.  Check your incision area every day for signs of infection. Check for: ? Redness, swelling, or pain. ? Fluid or blood. ? Warmth. ? Pus or a bad smell. Bathing   Do not take baths, swim, or use a hot tub until your health care provider approves. Ask your health care provider if you can take showers. You may only be allowed to take sponge baths for bathing.  Keep your bandage (dressing) dry until your health care provider says it can be removed. Activity  Do not lift anything that is heavier than 10 lb (4.5 kg) until your health care provider approves.  Do not drive or use heavy machinery while taking prescription pain medicine. Ask  your health care provider when it is safe for you to  drive or use heavy machinery.  Do not drive for 24 hours if you were given a medicine to help you relax (sedative) during your procedure.  Rest as told by your health care provider. You may return to your normal activities when your health care provider approves. General instructions  Take over-the-counter and prescription medicines only as told by your health care provider.  To prevent or treat constipation while you are taking prescription pain medicine, your health care provider may recommend that you: ? Take over-the-counter or prescription medicines. ? Eat foods that are high in fiber, such as fresh fruits and vegetables, whole grains, and beans. ? Limit foods that are high in fat and processed sugars, such as fried and sweet foods.  Drink enough fluid to keep your urine clear or pale yellow.  Hold a pillow over your abdomen when you cough or sneeze. This helps with pain.  Keep all follow-up visits as told by your health care provider. This is important. Contact a health care provider if:  You have: ? A fever or chills. ? Redness, swelling, or pain around your incision. ? Fluid or blood coming from your incision. ? Pus or a bad smell coming from your incision. ? Pain that gets worse or does not get better with medicine. ? Nausea or vomiting. ? A cough. ? Shortness of breath.  Your incision feels warm to the touch.  You have not had a bowel movement in three days.  You are not able to urinate. Get help right away if:  You have severe pain in your abdomen.  You have persistent nausea and vomiting.  You have redness, warmth, or pain in your leg.  You have chest pain.  You have trouble breathing. Summary  After this procedure, it is common to have pain, discomfort, or soreness.  Follow instructions from your health care provider about how to take care of your incision.  Check your incision area every day for signs of infection. Report any signs of infection to  your health care provider.  Keep all follow-up visits as told by your health care provider. This is important. This information is not intended to replace advice given to you by your health care provider. Make sure you discuss any questions you have with your health care provider. Document Released: 04/15/2012 Document Revised: 04/11/2017 Document Reviewed: 12/20/2015 Elsevier Patient Education  2020 [employer].

## 2018-11-30 ENCOUNTER — Other Ambulatory Visit: Payer: Self-pay

## 2018-11-30 ENCOUNTER — Encounter (HOSPITAL_COMMUNITY): Payer: Self-pay

## 2018-11-30 ENCOUNTER — Ambulatory Visit (HOSPITAL_COMMUNITY): Payer: Self-pay | Admitting: Anesthesiology

## 2018-11-30 ENCOUNTER — Encounter (HOSPITAL_COMMUNITY)
Admission: RE | Disposition: A | Discharge status: Home / Self Care | Payer: Self-pay | Source: Home / Self Care | Attending: General Surgery

## 2018-11-30 ENCOUNTER — Ambulatory Visit (HOSPITAL_COMMUNITY)
Admission: RE | Admit: 2018-11-30 | Discharge: 2018-11-30 | Disposition: A | Discharge status: Home / Self Care | Payer: Self-pay | Attending: General Surgery | Admitting: General Surgery

## 2018-11-30 DIAGNOSIS — F17210 Nicotine dependence, cigarettes, uncomplicated: Secondary | ICD-10-CM | POA: Insufficient documentation

## 2018-11-30 DIAGNOSIS — K429 Umbilical hernia without obstruction or gangrene: Secondary | ICD-10-CM | POA: Insufficient documentation

## 2018-11-30 HISTORY — PX: UMBILICAL HERNIA REPAIR: SHX196

## 2018-11-30 SURGERY — REPAIR, HERNIA, UMBILICAL, ADULT
Anesthesia: General

## 2018-11-30 MED ORDER — BUPIVACAINE LIPOSOME 1.3 % IJ SUSP
INTRAMUSCULAR | Status: AC
  Filled 2018-11-30: qty 20

## 2018-11-30 MED ORDER — KETOROLAC TROMETHAMINE 30 MG/ML IJ SOLN
30.0000 mg | Freq: Once | INTRAMUSCULAR | Status: AC
  Administered 2018-11-30: 30 mg via INTRAVENOUS
  Filled 2018-11-30: qty 1

## 2018-11-30 MED ORDER — PROPOFOL 10 MG/ML IV BOLUS
INTRAVENOUS | Status: DC | PRN
  Administered 2018-11-30: 100 mg via INTRAVENOUS
  Administered 2018-11-30: 300 mg via INTRAVENOUS

## 2018-11-30 MED ORDER — DEXAMETHASONE SODIUM PHOSPHATE 10 MG/ML IJ SOLN
INTRAMUSCULAR | Status: DC | PRN
  Administered 2018-11-30: 10 mg via INTRAVENOUS

## 2018-11-30 MED ORDER — DEXTROSE 5 % IV SOLN
3.0000 g | INTRAVENOUS | Status: AC
  Administered 2018-11-30: 07:00:00 3 g via INTRAVENOUS

## 2018-11-30 MED ORDER — HYDROCODONE-ACETAMINOPHEN 7.5-325 MG PO TABS: 1.0000 | ORAL_TABLET | Freq: Once | ORAL | Status: DC | PRN

## 2018-11-30 MED ORDER — CHLORHEXIDINE GLUCONATE CLOTH 2 % EX PADS: 6.0000 | MEDICATED_PAD | Freq: Once | CUTANEOUS | Status: DC

## 2018-11-30 MED ORDER — HYDROMORPHONE HCL 1 MG/ML IJ SOLN
0.2500 mg | INTRAMUSCULAR | Status: DC | PRN
  Administered 2018-11-30: 0.5 mg via INTRAVENOUS
  Filled 2018-11-30: qty 0.5

## 2018-11-30 MED ORDER — SUGAMMADEX SODIUM 500 MG/5ML IV SOLN
INTRAVENOUS | Status: AC
  Filled 2018-11-30: qty 5

## 2018-11-30 MED ORDER — ONDANSETRON HCL 4 MG/2ML IJ SOLN
INTRAMUSCULAR | Status: DC | PRN
  Administered 2018-11-30: 4 mg via INTRAVENOUS

## 2018-11-30 MED ORDER — FENTANYL CITRATE (PF) 100 MCG/2ML IJ SOLN
INTRAMUSCULAR | Status: DC | PRN
  Administered 2018-11-30: 100 ug via INTRAVENOUS
  Administered 2018-11-30: 150 ug via INTRAVENOUS

## 2018-11-30 MED ORDER — LACTATED RINGERS IV SOLN
INTRAVENOUS | Status: DC
  Administered 2018-11-30 (×2): via INTRAVENOUS

## 2018-11-30 MED ORDER — MIDAZOLAM HCL 5 MG/5ML IJ SOLN
INTRAMUSCULAR | Status: DC | PRN
  Administered 2018-11-30: 2 mg via INTRAVENOUS

## 2018-11-30 MED ORDER — SUGAMMADEX SODIUM 500 MG/5ML IV SOLN
INTRAVENOUS | Status: DC | PRN
  Administered 2018-11-30: 400 mg via INTRAVENOUS

## 2018-11-30 MED ORDER — MIDAZOLAM HCL 2 MG/2ML IJ SOLN
INTRAMUSCULAR | Status: AC
  Filled 2018-11-30: qty 2

## 2018-11-30 MED ORDER — PROMETHAZINE HCL 25 MG/ML IJ SOLN: 6.2500 mg | INTRAMUSCULAR | Status: DC | PRN

## 2018-11-30 MED ORDER — ROCURONIUM BROMIDE 10 MG/ML (PF) SYRINGE
PREFILLED_SYRINGE | INTRAVENOUS | Status: DC | PRN
  Administered 2018-11-30: 50 mg via INTRAVENOUS
  Administered 2018-11-30: 30 mg via INTRAVENOUS

## 2018-11-30 MED ORDER — SODIUM CHLORIDE 0.9 % IR SOLN
Status: DC | PRN
  Administered 2018-11-30: 1000 mL

## 2018-11-30 MED ORDER — PROPOFOL 10 MG/ML IV BOLUS
INTRAVENOUS | Status: AC
  Filled 2018-11-30: qty 40

## 2018-11-30 MED ORDER — FENTANYL CITRATE (PF) 250 MCG/5ML IJ SOLN
INTRAMUSCULAR | Status: AC
  Filled 2018-11-30: qty 5

## 2018-11-30 MED ORDER — OXYCODONE-ACETAMINOPHEN 7.5-325 MG PO TABS: 1.0000 | ORAL_TABLET | Freq: Four times a day (QID) | ORAL | 0 refills | Status: DC | PRN

## 2018-11-30 MED ORDER — CEFAZOLIN SODIUM-DEXTROSE 1-4 GM/50ML-% IV SOLN
INTRAVENOUS | Status: AC
  Filled 2018-11-30: qty 50

## 2018-11-30 MED ORDER — MIDAZOLAM HCL 2 MG/2ML IJ SOLN: 0.5000 mg | Freq: Once | INTRAMUSCULAR | Status: DC | PRN

## 2018-11-30 MED ORDER — CEFAZOLIN SODIUM-DEXTROSE 2-4 GM/100ML-% IV SOLN
INTRAVENOUS | Status: AC
  Filled 2018-11-30: qty 100

## 2018-11-30 MED ORDER — LIDOCAINE HCL URETHRAL/MUCOSAL 2 % EX GEL
CUTANEOUS | Status: DC | PRN
  Administered 2018-11-30: 60 via TOPICAL

## 2018-11-30 MED ORDER — BUPIVACAINE LIPOSOME 1.3 % IJ SUSP
INTRAMUSCULAR | Status: DC | PRN
  Administered 2018-11-30: 20 mL

## 2018-11-30 SURGICAL SUPPLY — 32 items
BLADE SURG SZ11 CARB STEEL (BLADE) ×3 IMPLANT
CHLORAPREP W/TINT 26 (MISCELLANEOUS) ×3 IMPLANT
CLOTH BEACON ORANGE TIMEOUT ST (SAFETY) ×3 IMPLANT
COVER LIGHT HANDLE STERIS (MISCELLANEOUS) ×6 IMPLANT
COVER WAND RF STERILE (DRAPES) ×2 IMPLANT
DERMABOND ADVANCED (GAUZE/BANDAGES/DRESSINGS) ×2
DERMABOND ADVANCED .7 DNX12 (GAUZE/BANDAGES/DRESSINGS) ×1 IMPLANT
ELECT REM PT RETURN 9FT ADLT (ELECTROSURGICAL) ×3
ELECTRODE REM PT RTRN 9FT ADLT (ELECTROSURGICAL) ×1 IMPLANT
GLOVE BIOGEL M 7.0 STRL (GLOVE) ×2 IMPLANT
GLOVE BIOGEL PI IND STRL 7.0 (GLOVE) ×2 IMPLANT
GLOVE BIOGEL PI INDICATOR 7.0 (GLOVE) ×4
GLOVE SURG SS PI 7.5 STRL IVOR (GLOVE) ×6 IMPLANT
GOWN STRL REUS W/TWL LRG LVL3 (GOWN DISPOSABLE) ×6 IMPLANT
INST SET MINOR GENERAL (KITS) ×3 IMPLANT
KIT TURNOVER KIT A (KITS) ×3 IMPLANT
MANIFOLD NEPTUNE II (INSTRUMENTS) ×3 IMPLANT
NDL HYPO 18GX1.5 BLUNT FILL (NEEDLE) IMPLANT
NDL HYPO 21X1.5 SAFETY (NEEDLE) ×1 IMPLANT
NEEDLE HYPO 18GX1.5 BLUNT FILL (NEEDLE) ×3 IMPLANT
NEEDLE HYPO 21X1.5 SAFETY (NEEDLE) ×3 IMPLANT
NS IRRIG 1000ML POUR BTL (IV SOLUTION) ×3 IMPLANT
PACK MINOR (CUSTOM PROCEDURE TRAY) ×3 IMPLANT
PAD ARMBOARD 7.5X6 YLW CONV (MISCELLANEOUS) ×3 IMPLANT
SET BASIN LINEN APH (SET/KITS/TRAYS/PACK) ×3 IMPLANT
SUT ETHIBOND NAB MO 7 #0 18IN (SUTURE) ×3 IMPLANT
SUT MNCRL AB 4-0 PS2 18 (SUTURE) ×3 IMPLANT
SUT VIC AB 2-0 CT2 27 (SUTURE) ×3 IMPLANT
SUT VIC AB 3-0 SH 27 (SUTURE) ×2
SUT VIC AB 3-0 SH 27X BRD (SUTURE) ×1 IMPLANT
SUT VICRYL AB 3 0 TIES (SUTURE) ×2 IMPLANT
SYR 20CC LL (SYRINGE) ×5 IMPLANT

## 2018-11-30 NOTE — Transfer of Care (Signed)
Immediate Anesthesia Transfer of Care Note  Patient: Gregory Fleming  Procedure(s) Performed: UMBILICAL HERNIA REPAIR (N/A )  Patient Location: PACU  Anesthesia Type:General  Level of Consciousness: awake, alert  and oriented  Airway & Oxygen Therapy: Patient Spontanous Breathing and Patient connected to nasal cannula oxygen  Post-op Assessment: Report given to RN, Post -op Vital signs reviewed and stable and Patient moving all extremities X 4  Post vital signs: Reviewed and stable  Last Vitals:  Vitals Value Taken Time  BP    Temp    Pulse    Resp    SpO2      Last Pain:  Vitals:   11/30/18 0643  TempSrc: Oral  PainSc: 3       Patients Stated Pain Goal: 7 (26/94/85 [id number])  Complications: No apparent anesthesia complications

## 2018-11-30 NOTE — Anesthesia Preprocedure Evaluation (Signed)
Anesthesia Evaluation  Patient identified by MRN, date of birth, ID band Patient awake    Reviewed: Allergy & Precautions, NPO status , Patient's Chart, lab work & pertinent test results  Airway Mallampati: I  TM Distance: >3 FB Neck ROM: Full    Dental no notable dental hx. (+) Teeth Intact   Pulmonary neg pulmonary ROS, Current Smoker,    Pulmonary exam normal breath sounds clear to auscultation       Cardiovascular Exercise Tolerance: Good negative cardio ROS Normal cardiovascular examI Rhythm:Regular Rate:Normal     Neuro/Psych Anxiety negative neurological ROS  negative psych ROS   GI/Hepatic negative GI ROS, Neg liver ROS,   Endo/Other  negative endocrine ROSBMI >37  Renal/GU negative Renal ROS  negative genitourinary   Musculoskeletal negative musculoskeletal ROS (+)   Abdominal   Peds negative pediatric ROS (+)  Hematology negative hematology ROS (+)   Anesthesia Other Findings   Reproductive/Obstetrics negative OB ROS                             Anesthesia Physical Anesthesia Plan  ASA: II  Anesthesia Plan: General   Post-op Pain Management:    Induction: Intravenous  PONV Risk Score and Plan: 1 and Ondansetron and Treatment may vary due to age or medical condition  Airway Management Planned: Oral ETT  Additional Equipment:   Intra-op Plan:   Post-operative Plan: Extubation in OR  Informed Consent: I have reviewed the patients History and Physical, chart, labs and discussed the procedure including the risks, benefits and alternatives for the proposed anesthesia with the patient or authorized representative who has indicated his/her understanding and acceptance.     Dental advisory given  Plan Discussed with: CRNA  Anesthesia Plan Comments: (Plan Full PPE use  Plan GETA- d/w pt -WTP)        Anesthesia Quick Evaluation

## 2018-11-30 NOTE — Op Note (Signed)
Patient:  Gregory Fleming  DOB:  Oct 14, 1995  MRN:  [id number]   Preop Diagnosis: Umbilical hernia  Postop Diagnosis: Same  Procedure: Umbilical herniorrhaphy  Surgeon: Aviva Signs, MD  Anes: General endotracheal  Indications: Patient is a 23 year old obese white male who presents with a small umbilical hernia.  The risks and benefits of the procedure including bleeding, infection, and the possibility of recurrence of the hernia were fully explained to the patient, who gave informed consent.  Procedure note: The patient was placed in the supine position.  After induction of general endotracheal anesthesia, the abdomen was prepped and draped using the usual sterile technique with ChloraPrep.  Surgical site confirmation was performed.  An infraumbilical incision was made down to the fascia.  The umbilicus was freed away from the underlying fascia.  The patient had a small 1/2 cm umbilical hernia with a small epiplocele present.  This was excised and the remaining adipose tissue was reduced into the abdominal cavity.  A prominent atrophic umbilical vessel was ligated using a 3-0 Vicryl tie.  The fascia was then reapproximated transversely using 0 Ethibond interrupted sutures.  The base of the umbilicus was secured back to the fascia using a 2-0 Vicryl interrupted suture.  The subcutaneous layer was reapproximated using 3-0 Vicryl interrupted suture.  Exparel was instilled into the surrounding wound.  The skin was closed using a 4-0 Monocryl subcuticular suture.  Dermabond was applied.  All tape and needle counts were correct at the end of the procedure.  Patient was extubated in the operating room and transferred to PACU in stable condition.  Complications: None  EBL: Minimal  Specimen: None

## 2018-11-30 NOTE — Anesthesia Procedure Notes (Signed)
Procedure Name: Intubation Date/Time: 11/30/2018 7:32 AM Performed by: Lenice Llamas, MD Pre-anesthesia Checklist: Patient identified, Patient being monitored, Timeout performed, Emergency Drugs available and Suction available Patient Re-evaluated:Patient Re-evaluated prior to induction Oxygen Delivery Method: Circle System Utilized Preoxygenation: Pre-oxygenation with 100% oxygen Induction Type: IV induction Ventilation: Mask ventilation without difficulty Laryngoscope Size: 3 and Miller Grade View: Grade II Tube type: Oral Tube size: 7.0 mm Number of attempts: 1 Airway Equipment and Method: stylet Placement Confirmation: ETT inserted through vocal cords under direct vision,  positive ETCO2 and breath sounds checked- equal and bilateral Secured at: 21 cm Tube secured with: Tape Dental Injury: Teeth and Oropharynx as per pre-operative assessment

## 2018-11-30 NOTE — Discharge Instructions (Signed)
Open Hernia Repair, Adult, Care After °This sheet gives you information about how to care for yourself after your procedure. Your health care provider may also give you more specific instructions. If you have problems or questions, contact your health care provider. °What can I expect after the procedure? °After the procedure, it is common to have: °· Mild discomfort. °· Slight bruising. °· Minor swelling. °· Pain in the abdomen. °Follow these instructions at home: °Incision care ° °· Follow instructions from your health care provider about how to take care of your incision area. Make sure you: °? Wash your hands with soap and water before you change your bandage (dressing). If soap and water are not available, use hand sanitizer. °? Change your dressing as told by your health care provider. °? Leave stitches (sutures), skin glue, or adhesive strips in place. These skin closures may need to stay in place for 2 weeks or longer. If adhesive strip edges start to loosen and curl up, you may trim the loose edges. Do not remove adhesive strips completely unless your health care provider tells you to do that. °· Check your incision area every day for signs of infection. Check for: °? More redness, swelling, or pain. °? More fluid or blood. °? Warmth. °? Pus or a bad smell. °Activity °· Do not drive or use heavy machinery while taking prescription pain medicine. Do not drive until your health care provider approves. °· Until your health care provider approves: °? Do not lift anything that is heavier than 10 lb (4.5 kg). °? Do not play contact sports. °· Return to your normal activities as told by your health care provider. Ask your health care provider what activities are safe. °General instructions °· To prevent or treat constipation while you are taking prescription pain medicine, your health care provider may recommend that you: °? Drink enough fluid to keep your urine clear or pale yellow. °? Take over-the-counter or  prescription medicines. °? Eat foods that are high in fiber, such as fresh fruits and vegetables, whole grains, and beans. °? Limit foods that are high in fat and processed sugars, such as fried and sweet foods. °· Take over-the-counter and prescription medicines only as told by your health care provider. °· Do not take tub baths or go swimming until your health care provider approves. °· Keep all follow-up visits as told by your health care provider. This is important. °Contact a health care provider if: °· You develop a rash. °· You have more redness, swelling, or pain around your incision. °· You have more fluid or blood coming from your incision. °· Your incision feels warm to the touch. °· You have pus or a bad smell coming from your incision. °· You have a fever or chills. °· You have blood in your stool (feces). °· You have not had a bowel movement in 2-3 days. °· Your pain is not controlled with medicine. °Get help right away if: °· You have chest pain or shortness of breath. °· You feel light-headed or feel faint. °· You have severe pain. °· You vomit and your pain is worse. °This information is not intended to replace advice given to you by your health care provider. Make sure you discuss any questions you have with your health care provider. °Document Released: 11/16/2004 Document Revised: 04/11/2017 Document Reviewed: 10/11/2015 °Elsevier Patient Education © 2020 Elsevier Inc. ° °

## 2018-11-30 NOTE — Interval H&P Note (Signed)
History and Physical Interval Note:  11/30/2018 7:06 AM  Gregory Fleming  has presented today for surgery, with the diagnosis of umbilical hernia.  The various methods of treatment have been discussed with the patient and family. After consideration of risks, benefits and other options for treatment, the patient has consented to  Procedure(s): HERNIA REPAIR UMBILICAL ADULT (N/A) as a surgical intervention.  The patient's history has been reviewed, patient examined, no change in status, stable for surgery.  I have reviewed the patient's chart and labs.  Questions were answered to the patient's satisfaction.     Aviva Signs

## 2018-12-01 ENCOUNTER — Encounter (HOSPITAL_COMMUNITY): Payer: Self-pay | Admitting: General Surgery

## 2018-12-01 NOTE — Anesthesia Postprocedure Evaluation (Signed)
Anesthesia Post Note  Patient: Gregory Fleming  Procedure(s) Performed: UMBILICAL HERNIA REPAIR (N/A )  Patient location during evaluation: PACU Anesthesia Type: General Level of consciousness: awake and alert and oriented Pain management: pain level controlled Vital Signs Assessment: post-procedure vital signs reviewed and stable Respiratory status: spontaneous breathing Cardiovascular status: stable Postop Assessment: no apparent nausea or vomiting Anesthetic complications: no Comments: Late entry.     Last Vitals:  Vitals:   11/30/18 0900 11/30/18 0915  BP: 118/76 122/78  Pulse: 89 94  Resp: 16 18  Temp:  36.9 C  SpO2: 93% 94%    Last Pain:  Vitals:   11/30/18 0915  TempSrc: Oral  PainSc: 4                  Chauntel Windsor

## 2018-12-02 ENCOUNTER — Encounter (HOSPITAL_COMMUNITY): Payer: Self-pay | Admitting: General Surgery

## 2018-12-02 NOTE — Addendum Note (Signed)
Addendum  created 12/02/18 1141 by Vista Deck, CRNA   Intraprocedure Event edited, Intraprocedure Staff edited

## 2018-12-08 ENCOUNTER — Other Ambulatory Visit: Payer: Self-pay

## 2018-12-08 ENCOUNTER — Telehealth: Payer: Self-pay | Admitting: General Surgery

## 2018-12-08 ENCOUNTER — Telehealth (INDEPENDENT_AMBULATORY_CARE_PROVIDER_SITE_OTHER): Payer: Self-pay | Admitting: General Surgery

## 2018-12-08 DIAGNOSIS — Z09 Encounter for follow-up examination after completed treatment for conditions other than malignant neoplasm: Secondary | ICD-10-CM

## 2018-12-08 MED ORDER — OXYCODONE-ACETAMINOPHEN 7.5-325 MG PO TABS: 1.0000 | ORAL_TABLET | Freq: Four times a day (QID) | ORAL | 0 refills | Status: AC | PRN

## 2018-12-08 NOTE — Telephone Encounter (Signed)
Postoperative telephone visit performed.  Patient states he is still having moderate incisional pain.  He otherwise is doing well.  The pain is made worse with some straining. I have reordered his Percocet for 1 more week due to postoperative pain.  He can return to work with restrictions starting 12/14/2018 for 2 weeks.  He then can return to full duty after that.

## 2019-05-18 ENCOUNTER — Other Ambulatory Visit: Payer: Self-pay

## 2019-05-18 ENCOUNTER — Ambulatory Visit: Payer: Self-pay | Attending: Internal Medicine

## 2019-05-18 DIAGNOSIS — Z20822 Contact with and (suspected) exposure to covid-19: Secondary | ICD-10-CM

## 2019-05-19 LAB — NOVEL CORONAVIRUS, NAA: SARS-CoV-2, NAA: NOT DETECTED

## 2019-06-02 ENCOUNTER — Other Ambulatory Visit: Payer: Self-pay

## 2019-06-02 ENCOUNTER — Ambulatory Visit: Payer: Self-pay | Attending: Internal Medicine

## 2019-06-02 DIAGNOSIS — Z20822 Contact with and (suspected) exposure to covid-19: Secondary | ICD-10-CM | POA: Insufficient documentation

## 2019-06-03 LAB — NOVEL CORONAVIRUS, NAA: SARS-CoV-2, NAA: NOT DETECTED

## 2019-09-14 ENCOUNTER — Other Ambulatory Visit: Payer: Self-pay

## 2019-09-14 ENCOUNTER — Ambulatory Visit
Admission: EM | Admit: 2019-09-14 | Discharge: 2019-09-14 | Disposition: A | Discharge status: Home / Self Care | Payer: Self-pay | Attending: Family Medicine | Admitting: Family Medicine

## 2019-09-14 DIAGNOSIS — R0789 Other chest pain: Secondary | ICD-10-CM

## 2019-09-14 DIAGNOSIS — M940 Chondrocostal junction syndrome [Tietze]: Secondary | ICD-10-CM

## 2019-09-14 MED ORDER — OMEPRAZOLE 20 MG PO CPDR: 20.0000 mg | DELAYED_RELEASE_CAPSULE | Freq: Every day | ORAL | 0 refills | Status: DC

## 2019-09-14 NOTE — Discharge Instructions (Addendum)
I have sent in omeprazole for you to take daily.  I would have you take tylenol for pain.  If you are not improving over the next 2-3 days, follow up with this office or with primary care.

## 2019-09-14 NOTE — ED Triage Notes (Signed)
Pt recovered from gi symptoms and then began having sob with chest pain with inspiration

## 2019-09-15 NOTE — ED Provider Notes (Signed)
[facility]   [id number] 09/14/19 Arrival Time: 0737   CC: CHEST PAIN  SUBJECTIVE:  Gregory Fleming is a 24 y.o. male who presents with complaint of gradual chest pain that began 2 days ago. Reports that he had a GI illness over the weekend and was vomiting excessively. Localizes chest pain to the substernal region.  Describes as improving, that is intermittent (with episodes last minutes) and stabbing in character.  Rates pain as 6/10.   Has not made attempts to treat at home.  Symptoms made worse with activity/cough/deep breathing. Denies radiating symptoms.  Denies previous symptoms in the past.  Denies fever, chills, lightheadedness, dizziness, palpitations, tachycardia, SOB, nausea, vomiting, abdominal pain, changes in bowel or bladder habits, diaphoresis, numbness/tingling in extremities, peripheral edema, or anxiety.    Denies close relatives with cardiac hx    ROS: As per HPI.  All other pertinent ROS negative.    Past Medical History:  Diagnosis Date  . Anxiety   . Knee pain    Past Surgical History:  Procedure Laterality Date  . UMBILICAL HERNIA REPAIR N/A 11/30/2018   Procedure: UMBILICAL HERNIA REPAIR;  Surgeon: Aviva Signs, MD;  Location: [facility];  Service: General;  Laterality: N/A;   No Known Allergies No current facility-administered medications on file prior to encounter.   Current Outpatient Medications on File Prior to Encounter  Medication Sig Dispense Refill  . acetaminophen (TYLENOL) 500 MG tablet Take 500 mg by mouth every 6 (six) hours as needed for moderate pain or headache.    . ALPRAZolam (XANAX) 1 MG tablet Take 1 mg by mouth as needed for anxiety. Not prescribed    . cetirizine (ZYRTEC) 10 MG tablet Take 10 mg by mouth daily as needed for allergies.    . fluticasone (FLONASE) 50 MCG/ACT nasal spray Place 1 spray into both nostrils daily as needed for allergies or rhinitis.    Marland Kitchen ibuprofen (ADVIL) 200 MG tablet Take 600 mg by mouth every 6  (six) hours as needed for headache or moderate pain.    Marland Kitchen oxyCODONE-acetaminophen (PERCOCET) 7.5-325 MG tablet Take 1 tablet by mouth every 6 (six) hours as needed. 25 tablet 0   Social History   Socioeconomic History  . Marital status: Married    Spouse name: Not on file  . Number of children: Not on file  . Years of education: Not on file  . Highest education level: Not on file  Occupational History  . Not on file  Tobacco Use  . Smoking status: Current Every Day Smoker    Packs/day: 1.00    Years: 8.00    Pack years: 8.00    Types: Cigarettes  . Smokeless tobacco: Current User    Types: Snuff  Substance and Sexual Activity  . Alcohol use: Yes    Alcohol/week: 7.0 standard drinks    Types: 7 Shots of liquor per week    Comment: occ  . Drug use: No  . Sexual activity: Not on file  Other Topics Concern  . Not on file  Social History Narrative  . Not on file   Social Determinants of Health   Financial Resource Strain:   . Difficulty of Paying Living Expenses:   Food Insecurity:   . Worried About Charity fundraiser in the Last Year:   . Arboriculturist in the Last Year:   Transportation Needs:   . Film/video editor (Medical):   Marland Kitchen Lack of Transportation (Non-Medical):   Physical  Activity:   . Days of Exercise per Week:   . Minutes of Exercise per Session:   Stress:   . Feeling of Stress :   Social Connections:   . Frequency of Communication with Friends and Family:   . Frequency of Social Gatherings with Friends and Family:   . Attends Religious Services:   . Active Member of Clubs or Organizations:   . Attends Banker Meetings:   Marland Kitchen Marital Status:   Intimate Partner Violence:   . Fear of Current or Ex-Partner:   . Emotionally Abused:   Marland Kitchen Physically Abused:   . Sexually Abused:    History reviewed. No pertinent family history.   OBJECTIVE:  Vitals:   09/14/19 1610  BP: 117/76  Pulse: 98  Resp: 20  Temp: 98.2 F (36.8 C)  SpO2:  95%    General appearance: alert; no distress Eyes: PERRLA; EOMI; conjunctiva normal HENT: normocephalic; atraumatic Neck: supple Lungs: clear to auscultation bilaterally without adventitious breath sounds Heart: regular rate and rhythm.  Clear S1 and S2 without rubs, gallops, or murmur. Chest Wall: mildly tender intercostal muscles on palpation no thrills Abdomen: soft, non-tender; bowel sounds normal; no masses or organomegaly; no guarding or rebound tenderness Extremities: no cyanosis or edema; symmetrical with no gross deformities Skin: warm and dry Psychological: alert and cooperative; normal mood and affect  ECG: No orders found for this or any previous visit.  LABS:  Results for orders placed or performed in visit on 06/02/19  Novel Coronavirus, NAA ([facility])   Specimen: Nasopharyngeal(NP) swabs in vial transport medium   NASOPHARYNGE  TESTING  Result Value Ref Range   SARS-CoV-2, NAA Not Detected Not Detected   Labs Reviewed - No data to display  DIAGNOSTIC STUDIES:  No results found.   ASSESSMENT & PLAN:  1. Chest wall pain   2. Burning chest pain   3. Costochondritis     Meds ordered this encounter  Medications  . omeprazole (PRILOSEC) 20 MG capsule    Sig: Take 1 capsule (20 mg total) by mouth daily.    Dispense:  30 capsule    Refill:  0    Order Specific Question:   Supervising Provider    Answer:   Merrilee Jansky [id number]    Patient history and exam consistent with non-cardiac cause of chest pain.  Pain likely costochondritis. No concern for ACS today Prescribed omeprazole to help with burning after vomiting illness. Chest pain precautions given. Reviewed expectations about course of current medical issues. Questions answered. Outlined signs and symptoms indicating need for more acute intervention. Patient verbalized understanding. After Visit Summary given.    Moshe Cipro, NP 09/15/19 1212

## 2020-03-20 ENCOUNTER — Other Ambulatory Visit: Payer: Self-pay

## 2020-03-20 ENCOUNTER — Ambulatory Visit (INDEPENDENT_AMBULATORY_CARE_PROVIDER_SITE_OTHER): Payer: 59 | Admitting: Urology

## 2020-03-20 ENCOUNTER — Encounter: Payer: Self-pay | Admitting: Urology

## 2020-03-20 VITALS — BP 137/92 | HR 120 | Wt 300.0 lb

## 2020-03-20 DIAGNOSIS — Z3009 Encounter for other general counseling and advice on contraception: Secondary | ICD-10-CM | POA: Insufficient documentation

## 2020-03-20 MED ORDER — DIAZEPAM 10 MG PO TABS: 10.0000 mg | ORAL_TABLET | Freq: Once | ORAL | 0 refills | Status: AC

## 2020-03-20 NOTE — Progress Notes (Signed)
Urological Symptom Review  Patient is experiencing the following symptoms: Sexually transmitted disease   Review of Systems  Gastrointestinal (upper)  : Indigestion/heartburn  Gastrointestinal (lower) : Negative for lower GI symptoms  Constitutional : Negative for symptoms  Skin: Negative for skin symptoms  Eyes: Negative for eye symptoms  Ear/Nose/Throat : Sinus problems  Hematologic/Lymphatic: Negative for Hematologic/Lymphatic symptoms  Cardiovascular : Negative for cardiovascular symptoms  Respiratory : Negative for respiratory symptoms  Endocrine: Negative for endocrine symptoms  Musculoskeletal: Negative for musculoskeletal symptoms  Neurological: Negative for neurological symptoms  Psychologic: Negative for psychiatric symptoms

## 2020-03-20 NOTE — Progress Notes (Signed)
03/20/2020 10:24 AM   Gregory Fleming 07-Sep-1995 [id number]  Referring provider: No referring provider defined for this encounter.  Desires sterilization  HPI: Mr Fleck is a 24yo here for evaluation of vasectomy. 2 healthy children ages 55 and 2. No scrotal surgeries. No LUTS. No ED   PMH: Past Medical History:  Diagnosis Date  . Anxiety   . Herpes   . Knee pain     Surgical History: Past Surgical History:  Procedure Laterality Date  . UMBILICAL HERNIA REPAIR N/A 11/30/2018   Procedure: UMBILICAL HERNIA REPAIR;  Surgeon: Franky Macho, MD;  Location: [facility];  Service: General;  Laterality: N/A;    Home Medications:  Allergies as of 03/20/2020   No Known Allergies     Medication List       Accurate as of March 20, 2020 10:24 AM. If you have any questions, ask your nurse or doctor.        STOP taking these medications   ALPRAZolam 1 MG tablet Commonly known as: Prudy Feeler Stopped by: Wilkie Aye, MD   cetirizine 10 MG tablet Commonly known as: ZYRTEC Stopped by: Wilkie Aye, MD   fluticasone 50 MCG/ACT nasal spray Commonly known as: FLONASE Stopped by: Wilkie Aye, MD     TAKE these medications   acetaminophen 500 MG tablet Commonly known as: TYLENOL Take 500 mg by mouth every 6 (six) hours as needed for moderate pain or headache.   buPROPion 150 MG 24 hr tablet Commonly known as: WELLBUTRIN XL Take 150 mg by mouth daily.   ibuprofen 200 MG tablet Commonly known as: ADVIL Take 600 mg by mouth every 6 (six) hours as needed for headache or moderate pain.   imiquimod 5 % cream Commonly known as: ALDARA Apply topically 3 (three) times a week.   omeprazole 20 MG capsule Commonly known as: PRILOSEC Take 1 capsule (20 mg total) by mouth daily.       Allergies: No Known Allergies  Family History: No family history on file.  Social History:  reports that he has been smoking cigarettes. He has a 8.00 pack-year smoking history.  His smokeless tobacco use includes snuff. He reports current alcohol use of about 7.0 standard drinks of alcohol per week. He reports that he does not use drugs.  ROS: All other review of systems were reviewed and are negative except what is noted above in HPI  Physical Exam: BP (!) 137/92   Pulse (!) 120   Wt 300 lb (136.1 kg)   BMI 41.84 kg/m   Constitutional:  Alert and oriented, No acute distress. HEENT: [place] AT, moist mucus membranes.  Trachea midline, no masses. Cardiovascular: No clubbing, cyanosis, or edema. Respiratory: Normal respiratory effort, no increased work of breathing. GI: Abdomen is soft, nontender, nondistended, no abdominal masses GU: No CVA tenderness. Circumcised phallus. No masses/lesions on penis, testis, scrotum. Prostate 40g smooth no nodules no induration.  Lymph: No cervical or inguinal lymphadenopathy. Skin: No rashes, bruises or suspicious lesions. Neurologic: Grossly intact, no focal deficits, moving all 4 extremities. Psychiatric: Normal mood and affect.  Laboratory Data: No results found for: WBC, HGB, HCT, MCV, PLT  No results found for: CREATININE  No results found for: PSA  No results found for: TESTOSTERONE  No results found for: HGBA1C  Urinalysis No results found for: COLORURINE, APPEARANCEUR, LABSPEC, PHURINE, GLUCOSEU, HGBUR, BILIRUBINUR, KETONESUR, PROTEINUR, UROBILINOGEN, NITRITE, LEUKOCYTESUR  No results found for: LABMICR, WBCUA, RBCUA, LABEPIT, MUCUS, BACTERIA  Pertinent Imaging:  No results found  for this or any previous visit.  No results found for this or any previous visit.  No results found for this or any previous visit.  No results found for this or any previous visit.  No results found for this or any previous visit.  No results found for this or any previous visit.  No results found for this or any previous visit.  No results found for this or any previous visit.   Assessment & Plan:    1. Vasectomy  evaluation -Schedule for vasectomy -Rx for valium given   No follow-ups on file.  Wilkie Aye, MD  [facility] [place]

## 2020-03-20 NOTE — Patient Instructions (Signed)
Vasectomy Vasectomy is a procedure in which the tube that carries sperm from the testicle to the urethra (vas deferens) is tied. It may also be cut. The procedure blocks sperm from going through the vas deferens and penis during ejaculation. This ensures that sperm does not go into the vagina during sex. Vasectomy does not affect your sexual desire or performance, and does not prevent sexually transmitted diseases. Vasectomy is considered a permanent and very effective form of birth control (contraception). The decision to have a vasectomy should not be made during a stressful situation, such as after the loss of a pregnancy or a divorce. You and your partner should make the decision to have a vasectomy when you are sure that you do not want children in the future. Tell a health care provider about:  Any allergies you have.  All medicines you are taking, including vitamins, herbs, eye drops, creams, and over-the-counter medicines.  Any problems you or family members have had with anesthetic medicines.  Any blood disorders you have.  Any surgeries you have had.  Any medical conditions you have. What are the risks? Generally, this is a safe procedure. However, problems may occur, including:  Infection.  Bleeding and swelling of the scrotum.  Allergic reactions to medicines.  Failure of the procedure to prevent pregnancy. There is a very small chance that the cut ends of the vas deferens may reconnect (recanalization), meaning that you could still make a woman pregnant.  Pain in the scrotum that continues after healing from the procedure. What happens before the procedure?  Ask your health care provider about: ? Changing or stopping your regular medicines. This is especially important if you are taking diabetes medicines or blood thinners. ? Taking over-the-counter medicines, vitamins, herbs, and supplements. ? Taking medicines such as aspirin and ibuprofen. These medicines can thin  your blood. Do not take these medicines unless your health care provider tells you to take them.  You may be asked to shower with a germ-killing soap.  Plan to have someone take you home from the hospital or clinic. What happens during the procedure?   To lower your risk of infection: ? Your health care team will wash or sanitize their hands. ? Hair may be removed from the surgical area. ? Your scrotum will be washed with soap.  You will be given one or more of the following: ? A medicine to help you relax (sedative). You may be instructed to take this a few hours before the procedure. ? A medicine to numb the area (local anesthetic).  Your health care provider will feel (palpate) for your vas deferens.  To reach the vas deferens, one of two methods may be used: ? A very small incision may be made in your scrotum. ? A punctured opening may be made in your scrotum, without an incision.  Your vas deferens will be pulled out of your scrotum, and may be: ? Tied off. ? Cut and possibly burned (cauterized) at the ends to seal them off.  The vas deferens will be put back into your scrotum.  The incision or puncture opening will be closed with absorbable stitches (sutures). The sutures will eventually dissolve and will not need to be removed after the procedure. The procedure may vary among health care providers and hospitals. What happens after the procedure?  You will be monitored to make sure that you do not experience problems.  You will be asked not to ejaculate for at least 1 week after   the procedure, or as long as directed.  You will need to use a different form of contraception for 2-4 months after the procedure, until you have test results confirming that there are no sperm in your semen.  You may be given scrotal support to wear, such as a jock strap or underwear with a supportive pouch.  Do not drive for 24 hours if you were given a sedative to help you  relax. Summary  Vasectomy is considered a permanent and very effective form of birth control (contraception). The procedure prevents sperm from being released during ejaculation.  Your scrotum will be numbed with medicine (local anesthetic) for the procedure.  After the procedure, you will be asked not to ejaculate for at least 1 week, or for as long as directed. You will also need to use a different form of contraception until your health care provider examines you and finds that there are no sperm in your semen. This information is not intended to replace advice given to you by your health care provider. Make sure you discuss any questions you have with your health care provider. Document Revised: 10/31/2017 Document Reviewed: 07/26/2016 Elsevier Patient Education  2020 Elsevier Inc.  

## 2020-04-27 ENCOUNTER — Encounter: Payer: Self-pay | Admitting: Urology

## 2020-04-27 ENCOUNTER — Other Ambulatory Visit: Payer: Self-pay

## 2020-04-27 ENCOUNTER — Ambulatory Visit (INDEPENDENT_AMBULATORY_CARE_PROVIDER_SITE_OTHER): Payer: 59 | Admitting: Urology

## 2020-04-27 VITALS — BP 123/78 | HR 91 | Temp 98.7°F | Ht 71.0 in | Wt 300.0 lb

## 2020-04-27 DIAGNOSIS — Z9852 Vasectomy status: Secondary | ICD-10-CM | POA: Diagnosis not present

## 2020-04-27 NOTE — Patient Instructions (Signed)
Vasectomy Postoperative Instructions  Please bring back a semen analysis in approximately 3 months.  Your semen analysis  will need to be taken to  [facility] [address] [place], [place] [address] There phone number is [phone]. Please call and schedule an appointment before taking your specimen.    You will be given a sterile specimen cup. Please label the cup with your name, date of birth, date and time of collections.  What to Expect  - slight redness, swelling and scant drainage along the incision  - mild to moderate discomfort  - black and blue (bruising) as the tissue heals  - low grade fever  - scrotal sensitivity and/or tenderness - Edges of the incision may pull apart and heal slowly, sometimes a knot may be present which remains for several months.  This is NORMAL and all part of the healing process. - if stitches are placed, they do not need to be removed - if you have pain or discomfort immediately after the vasectomy, you may use OTC pain medication for relief , ex: tylenol.  After local anesthetic wears off an ice pack will provide additional comfort and can also prevent swelling if used  Activity  - no sexual intercourse for at lease 5 days depending on comfort  - no heavy lifting for 48-72 hours (anything over 5-10 lbs)  Wound Care  - shower only after 24 hours  - no tub baths, hot tub, or pools for at least 7 days  - ice packs for 48 hours: 30 minutes on and 30 minutes off  Problem to Report  - generalized redness  - increased pain and swelling  - fever greater than 101 F  - significant drainage or bleeding from the wound  TO DO - Ejaculations help to clear the passage of sperm, but you must use another from of birth control until you are told you may discontinue its use!! - You will be given a specimen cup to bring back a semen sample in 3 months to check and see if its clear of sperm.  Only after the semen is sent for analysis and is reported back as clear  should you use this as your primary form of birth control!   

## 2020-04-27 NOTE — Progress Notes (Signed)

## 2020-04-28 ENCOUNTER — Encounter: Payer: Self-pay | Admitting: Urology

## 2020-04-28 NOTE — Progress Notes (Signed)
04/27/2020  CC: desires sterilization  HPI: Mr Gregory Fleming is a 24yo here for vasectomy  Blood pressure 123/78, pulse 91, temperature 98.7 F (37.1 C), height 5\' 11"  (1.803 m), weight 300 lb (136.1 kg). NED. A&Ox3.   No respiratory distress   Abd soft, NT, ND Normal external genitalia with patent urethral meatus   Bilateral Vasectomy Procedure  Pre-Procedure: - Patient's scrotum was prepped and draped for vasectomy. - The vas was palpated through the scrotal skin on the left. - 1% Xylocaine was injected into the skin and surrounding tissue for placement  - In a similar manner, the vas on the right was identified, anesthetized, and stabilized.  Procedure: - A #11 blade was used to make a small stab incision in the skin overlying the vas - The left vas was isolated and brought up through the incision exposing that structure. - Bleeding points were cauterized as they occurred. - The vas was free from the surrounding structures and brought to the view. - A segment was positioned for placement with a hemostat. - A second hemostat was placed and a small segment between the two hemostats and was removed for inspection. - Each end of the transected vas lumen was fulgurated/ obliterated using needlepoint electrocautery -A fascial interposition was performed on testicular end of the vas using #3-0 chromic suture -The same procedure was performed on the right. - A single suture of #3-0 chromic catgut was used to close each lateral scrotal skin incision - A dressing was applied.  Post-Procedure: - Patient was instructed in care of the operative area - A specimen is to be delivered in 12 weeks   -Another form of contraception is to be used until post vasectomy semen analysis  , MD

## 2020-10-24 ENCOUNTER — Other Ambulatory Visit: Payer: Self-pay

## 2020-10-24 ENCOUNTER — Other Ambulatory Visit (HOSPITAL_COMMUNITY): Payer: Self-pay | Admitting: Family Medicine

## 2020-10-24 ENCOUNTER — Ambulatory Visit (HOSPITAL_COMMUNITY)
Admission: RE | Admit: 2020-10-24 | Discharge: 2020-10-24 | Disposition: A | Discharge status: Home / Self Care | Payer: BC Managed Care – PPO | Source: Ambulatory Visit | Attending: Family Medicine | Admitting: Family Medicine

## 2020-10-24 DIAGNOSIS — S99921A Unspecified injury of right foot, initial encounter: Secondary | ICD-10-CM | POA: Diagnosis not present

## 2020-10-24 DIAGNOSIS — M25562 Pain in left knee: Secondary | ICD-10-CM

## 2020-10-24 DIAGNOSIS — M79671 Pain in right foot: Secondary | ICD-10-CM

## 2020-12-05 DIAGNOSIS — J0100 Acute maxillary sinusitis, unspecified: Secondary | ICD-10-CM | POA: Insufficient documentation

## 2020-12-05 DIAGNOSIS — R051 Acute cough: Secondary | ICD-10-CM | POA: Diagnosis not present

## 2020-12-05 DIAGNOSIS — R059 Cough, unspecified: Secondary | ICD-10-CM | POA: Insufficient documentation

## 2021-03-06 DIAGNOSIS — R0981 Nasal congestion: Secondary | ICD-10-CM | POA: Diagnosis not present

## 2021-03-06 DIAGNOSIS — K219 Gastro-esophageal reflux disease without esophagitis: Secondary | ICD-10-CM | POA: Diagnosis not present

## 2021-04-29 DIAGNOSIS — R234 Changes in skin texture: Secondary | ICD-10-CM | POA: Diagnosis not present

## 2021-05-18 DIAGNOSIS — F17218 Nicotine dependence, cigarettes, with other nicotine-induced disorders: Secondary | ICD-10-CM | POA: Diagnosis not present

## 2021-05-18 DIAGNOSIS — K219 Gastro-esophageal reflux disease without esophagitis: Secondary | ICD-10-CM | POA: Diagnosis not present

## 2021-05-18 DIAGNOSIS — Z0189 Encounter for other specified special examinations: Secondary | ICD-10-CM | POA: Diagnosis not present

## 2021-05-18 DIAGNOSIS — F17228 Nicotine dependence, chewing tobacco, with other nicotine-induced disorders: Secondary | ICD-10-CM | POA: Diagnosis not present

## 2021-05-18 DIAGNOSIS — Z712 Person consulting for explanation of examination or test findings: Secondary | ICD-10-CM | POA: Diagnosis not present

## 2021-05-25 DIAGNOSIS — E782 Mixed hyperlipidemia: Secondary | ICD-10-CM | POA: Diagnosis not present

## 2021-05-25 DIAGNOSIS — M79642 Pain in left hand: Secondary | ICD-10-CM | POA: Diagnosis not present

## 2021-06-04 DIAGNOSIS — R0600 Dyspnea, unspecified: Secondary | ICD-10-CM | POA: Diagnosis not present

## 2021-06-04 DIAGNOSIS — R0989 Other specified symptoms and signs involving the circulatory and respiratory systems: Secondary | ICD-10-CM | POA: Diagnosis not present

## 2021-06-04 DIAGNOSIS — R6889 Other general symptoms and signs: Secondary | ICD-10-CM | POA: Insufficient documentation

## 2021-06-04 DIAGNOSIS — R059 Cough, unspecified: Secondary | ICD-10-CM | POA: Diagnosis not present

## 2021-06-04 DIAGNOSIS — J069 Acute upper respiratory infection, unspecified: Secondary | ICD-10-CM | POA: Diagnosis not present

## 2021-07-27 DIAGNOSIS — J069 Acute upper respiratory infection, unspecified: Secondary | ICD-10-CM | POA: Diagnosis not present

## 2021-08-06 DIAGNOSIS — M545 Low back pain, unspecified: Secondary | ICD-10-CM | POA: Diagnosis not present

## 2021-09-04 ENCOUNTER — Other Ambulatory Visit: Payer: Self-pay

## 2021-09-04 ENCOUNTER — Emergency Department (HOSPITAL_COMMUNITY): Payer: BC Managed Care – PPO

## 2021-09-04 ENCOUNTER — Encounter (HOSPITAL_COMMUNITY): Payer: Self-pay | Admitting: Emergency Medicine

## 2021-09-04 ENCOUNTER — Emergency Department (HOSPITAL_COMMUNITY)
Admission: EM | Admit: 2021-09-04 | Discharge: 2021-09-04 | Disposition: A | Discharge status: Home / Self Care | Payer: BC Managed Care – PPO | Attending: Emergency Medicine | Admitting: Emergency Medicine

## 2021-09-04 DIAGNOSIS — M545 Low back pain, unspecified: Secondary | ICD-10-CM | POA: Diagnosis not present

## 2021-09-04 DIAGNOSIS — S3992XA Unspecified injury of lower back, initial encounter: Secondary | ICD-10-CM | POA: Diagnosis not present

## 2021-09-04 DIAGNOSIS — X501XXA Overexertion from prolonged static or awkward postures, initial encounter: Secondary | ICD-10-CM | POA: Diagnosis not present

## 2021-09-04 MED ORDER — CYCLOBENZAPRINE HCL 10 MG PO TABS
10.0000 mg | ORAL_TABLET | Freq: Once | ORAL | Status: AC
  Administered 2021-09-04: 10 mg via ORAL
  Filled 2021-09-04: qty 1

## 2021-09-04 MED ORDER — CYCLOBENZAPRINE HCL 10 MG PO TABS: 10.0000 mg | ORAL_TABLET | Freq: Three times a day (TID) | ORAL | 0 refills | Status: DC | PRN

## 2021-09-04 MED ORDER — OXYCODONE-ACETAMINOPHEN 5-325 MG PO TABS: 1.0000 | ORAL_TABLET | ORAL | 0 refills | Status: DC | PRN

## 2021-09-04 MED ORDER — OXYCODONE-ACETAMINOPHEN 5-325 MG PO TABS
1.0000 | ORAL_TABLET | Freq: Once | ORAL | Status: AC
  Administered 2021-09-04: 1 via ORAL
  Filled 2021-09-04: qty 1

## 2021-09-04 MED ORDER — PREDNISONE 10 MG PO TABS: ORAL_TABLET | ORAL | 0 refills | Status: DC

## 2021-09-04 MED ORDER — KETOROLAC TROMETHAMINE 60 MG/2ML IM SOLN
60.0000 mg | Freq: Once | INTRAMUSCULAR | Status: AC
  Administered 2021-09-04: 60 mg via INTRAMUSCULAR
  Filled 2021-09-04: qty 2

## 2021-09-04 NOTE — Discharge Instructions (Addendum)
Take the medication as directed.  Alternate with ice packs and heat to your lower back.  Avoid twisting movements bending or heavy lifting for at least 1 week.  Follow-up with one of the providers listed in 1 week if your symptoms are not improving ?
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
## 2021-10-29 DIAGNOSIS — M5146 Schmorl's nodes, lumbar region: Secondary | ICD-10-CM | POA: Insufficient documentation

## 2021-10-29 DIAGNOSIS — M5126 Other intervertebral disc displacement, lumbar region: Secondary | ICD-10-CM | POA: Insufficient documentation

## 2021-10-29 DIAGNOSIS — J0100 Acute maxillary sinusitis, unspecified: Secondary | ICD-10-CM | POA: Diagnosis not present

## 2021-11-23 DIAGNOSIS — M5416 Radiculopathy, lumbar region: Secondary | ICD-10-CM | POA: Diagnosis not present

## 2021-11-23 DIAGNOSIS — Z6841 Body Mass Index (BMI) 40.0 and over, adult: Secondary | ICD-10-CM | POA: Diagnosis not present

## 2021-11-26 ENCOUNTER — Other Ambulatory Visit: Payer: Self-pay | Admitting: Neurosurgery

## 2021-11-26 DIAGNOSIS — M5416 Radiculopathy, lumbar region: Secondary | ICD-10-CM

## 2021-12-08 ENCOUNTER — Other Ambulatory Visit: Payer: BC Managed Care – PPO

## 2021-12-17 ENCOUNTER — Ambulatory Visit
Admission: RE | Admit: 2021-12-17 | Discharge: 2021-12-17 | Disposition: A | Discharge status: Home / Self Care | Payer: BC Managed Care – PPO | Source: Ambulatory Visit | Attending: Neurosurgery | Admitting: Neurosurgery

## 2021-12-17 DIAGNOSIS — M5416 Radiculopathy, lumbar region: Secondary | ICD-10-CM | POA: Diagnosis not present

## 2021-12-17 DIAGNOSIS — M545 Low back pain, unspecified: Secondary | ICD-10-CM | POA: Diagnosis not present

## 2021-12-25 ENCOUNTER — Ambulatory Visit (HOSPITAL_COMMUNITY): Payer: BC Managed Care – PPO | Attending: Neurosurgery | Admitting: Physical Therapy

## 2022-01-19 ENCOUNTER — Telehealth: Payer: BC Managed Care – PPO | Admitting: Nurse Practitioner

## 2022-01-19 DIAGNOSIS — J0100 Acute maxillary sinusitis, unspecified: Secondary | ICD-10-CM

## 2022-01-19 MED ORDER — DOXYCYCLINE HYCLATE 100 MG PO TABS: 100.0000 mg | ORAL_TABLET | Freq: Two times a day (BID) | ORAL | 0 refills | Status: DC

## 2022-01-19 MED ORDER — AMOXICILLIN-POT CLAVULANATE 875-125 MG PO TABS: 1.0000 | ORAL_TABLET | Freq: Two times a day (BID) | ORAL | 0 refills | Status: DC

## 2022-01-19 MED ORDER — PREDNISONE 20 MG PO TABS: 40.0000 mg | ORAL_TABLET | Freq: Every day | ORAL | 0 refills | Status: AC

## 2022-01-19 NOTE — Patient Instructions (Signed)
1. Take meds as prescribed 2. Use a cool mist humidifier especially during the winter months and when heat has been humid. 3. Use saline nose sprays frequently 4. Saline irrigations of the nose can be very helpful if done frequently.  * 4X daily for 1 week*  * Use of a nettie pot can be helpful with this. Follow directions with this* 5. Drink plenty of fluids 6. Keep thermostat turn down low 7.For any cough or congestion- PTC cough meds 8. For fever or aces or pains- take tylenol or ibuprofen appropriate for age and weight.  * for fevers greater than 101 orally you may alternate ibuprofen and tylenol every  3 hours.

## 2022-01-19 NOTE — Progress Notes (Signed)
Virtual Visit Consent   AKRAM KISSICK, you are scheduled for a virtual visit with Mary-Margaret Daphine Deutscher, FNP, a [facility] provider, today.     Just as with appointments in the office, your consent must be obtained to participate.  Your consent will be active for this visit and any virtual visit you may have with one of our providers in the next 365 days.     If you have a MyChart account, a copy of this consent can be sent to you electronically.  All virtual visits are billed to your insurance company just like a traditional visit in the office.    As this is a virtual visit, video technology does not allow for your provider to perform a traditional examination.  This may limit your provider's ability to fully assess your condition.  If your provider identifies any concerns that need to be evaluated in person or the need to arrange testing (such as labs, EKG, etc.), we will make arrangements to do so.     Although advances in technology are sophisticated, we cannot ensure that it will always work on either your end or our end.  If the connection with a video visit is poor, the visit may have to be switched to a telephone visit.  With either a video or telephone visit, we are not always able to ensure that we have a secure connection.     I need to obtain your verbal consent now.   Are you willing to proceed with your visit today? YES   AMOND SPERANZA has provided verbal consent on 25/01/2022 for a virtual visit (video or telephone).   Mary-Margaret Daphine Deutscher, FNP   Date: 01/19/2022 5:25 PM   Virtual Visit via Video Note   I, Mary-Margaret Daphine Deutscher, connected with DAVIYON WIDMAYER ([id number], [birth year]) on 01/19/22 at  6:00 PM EDT by a video-enabled telemedicine application and verified that I am speaking with the correct person using two identifiers.  Location: Patient: Virtual Visit Location Patient: Home Provider: Virtual Visit Location Provider: Mobile   I discussed the  limitations of evaluation and management by telemedicine and the availability of in person appointments. The patient expressed understanding and agreed to proceed.    History of Present Illness: MCADOO MUZQUIZ is a 26 y.o. who identifies as a male who was assigned male at birth, and is being seen today for uri .  HPI: URI  This is a new problem. The current episode started in the past 7 days. The problem has been gradually worsening. The maximum temperature recorded prior to his arrival was 100.4 - 100.9 F. The fever has been present for Less than 1 day. Associated symptoms include congestion, coughing, headaches (sinus headache) and rhinorrhea. Treatments tried: dayquil. The treatment provided no relief.    Review of Systems  HENT:  Positive for congestion and rhinorrhea.   Respiratory:  Positive for cough.   Neurological:  Positive for headaches (sinus headache).    Problems:  Patient Active Problem List   Diagnosis Date Noted   Vasectomy evaluation 03/20/2020   Umbilical hernia without obstruction and without gangrene     Allergies: No Known Allergies Medications:  Current Outpatient Medications:    acetaminophen (TYLENOL) 500 MG tablet, Take 500 mg by mouth every 6 (six) hours as needed for moderate pain or headache., Disp: , Rfl:    buPROPion (WELLBUTRIN XL) 150 MG 24 hr tablet, Take 150 mg by mouth daily., Disp: , Rfl:    cyclobenzaprine (  FLEXERIL) 10 MG tablet, Take 1 tablet (10 mg total) by mouth 3 (three) times daily as needed., Disp: 21 tablet, Rfl: 0   ibuprofen (ADVIL) 200 MG tablet, Take 600 mg by mouth every 6 (six) hours as needed for headache or moderate pain., Disp: , Rfl:    imiquimod (ALDARA) 5 % cream, Apply topically 3 (three) times a week., Disp: , Rfl:    omeprazole (PRILOSEC) 20 MG capsule, Take 1 capsule (20 mg total) by mouth daily., Disp: 30 capsule, Rfl: 0   oxyCODONE-acetaminophen (PERCOCET/ROXICET) 5-325 MG tablet, Take 1 tablet by mouth every 4 (four)  hours as needed., Disp: 12 tablet, Rfl: 0   predniSONE (DELTASONE) 10 MG tablet, Take 6 tablets day one, 5 tablets day two, 4 tablets day three, 3 tablets day four, 2 tablets day five, then 1 tablet day six, Disp: 21 tablet, Rfl: 0  Observations/Objective: Patient is well-developed, well-nourished in no acute distress.  Resting comfortably  at home.  Head is normocephalic, atraumatic.  No labored breathing.  Speech is clear and coherent with logical content.  Patient is alert and oriented at baseline.  Raspy voice Deep cough  Assessment and Plan:  KRISTOPH SATTLER in today with chief complaint of URI   1. Acute non-recurrent maxillary sinusitis 1. Take meds as prescribed 2. Use a cool mist humidifier especially during the winter months and when heat has been humid. 3. Use saline nose sprays frequently 4. Saline irrigations of the nose can be very helpful if done frequently.  * 4X daily for 1 week*  * Use of a nettie pot can be helpful with this. Follow directions with this* 5. Drink plenty of fluids 6. Keep thermostat turn down low 7.For any cough or congestion- OTC cough meds 8. For fever or aces or pains- take tylenol or ibuprofen appropriate for age and weight.  * for fevers greater than 101 orally you may alternate ibuprofen and tylenol every  3 hours.   Meds ordered this encounter  Medications   DISCONTD: amoxicillin-clavulanate (AUGMENTIN) 875-125 MG tablet    Sig: Take 1 tablet by mouth 2 (two) times daily.    Dispense:  20 tablet    Refill:  0    Order Specific Question:   Supervising Provider    Answer:   Hyacinth Meeker, BRIAN [[id number]]   doxycycline (VIBRA-TABS) 100 MG tablet    Sig: Take 1 tablet (100 mg total) by mouth 2 (two) times daily. 1 po bid    Dispense:  20 tablet    Refill:  0    Order Specific Question:   Supervising Provider    Answer:   Hyacinth Meeker, BRIAN [[id number]]   predniSONE (DELTASONE) 20 MG tablet    Sig: Take 2 tablets (40 mg total) by mouth daily with  breakfast for 5 days. 2 po daily for 5 days    Dispense:  10 tablet    Refill:  0    Order Specific Question:   Supervising Provider    Answer:   Eber Hong [[id number]]   Patient refused augmentin due to makes his stomach upset   Follow Up Instructions: I discussed the assessment and treatment plan with the patient. The patient was provided an opportunity to ask questions and all were answered. The patient agreed with the plan and demonstrated an understanding of the instructions.  A copy of instructions were sent to the patient via MyChart.  The patient was advised to call back or seek an in-person evaluation if the symptoms  worsen or if the condition fails to improve as anticipated.  Time:  I spent 6 minutes with the patient via telehealth technology discussing the above problems/concerns.

## 2022-01-29 ENCOUNTER — Ambulatory Visit (HOSPITAL_COMMUNITY): Payer: BC Managed Care – PPO | Attending: Neurosurgery

## 2022-05-13 ENCOUNTER — Encounter: Payer: BC Managed Care – PPO | Admitting: Physician Assistant

## 2022-05-13 ENCOUNTER — Telehealth: Payer: BC Managed Care – PPO | Admitting: Nurse Practitioner

## 2022-05-13 DIAGNOSIS — J208 Acute bronchitis due to other specified organisms: Secondary | ICD-10-CM | POA: Diagnosis not present

## 2022-05-13 MED ORDER — PREDNISONE 10 MG (21) PO TBPK: ORAL_TABLET | ORAL | 0 refills | Status: DC

## 2022-05-13 MED ORDER — ALBUTEROL SULFATE HFA 108 (90 BASE) MCG/ACT IN AERS: 2.0000 | INHALATION_SPRAY | Freq: Four times a day (QID) | RESPIRATORY_TRACT | 0 refills | Status: DC | PRN

## 2022-05-13 NOTE — Progress Notes (Signed)
Pt seen via e-visit. Erroneous encounter.

## 2022-05-13 NOTE — Progress Notes (Signed)
We are sorry that you are not feeling well.  Here is how we plan to help!  Based on your presentation I believe you most likely have A cough due to a virus.  This is called viral bronchitis and is best treated by rest, plenty of fluids and control of the cough.  You may use Ibuprofen or Tylenol as directed to help your symptoms.     In addition you may use A non-prescription cough medication called Mucinex DM: take 2 tablets every 12 hours.  Prednisone 10 mg daily for 6 days (see taper instructions below)  We will also prescribe an inhaler to help with your cough and wheezing.    Meds ordered this encounter  Medications   predniSONE (STERAPRED UNI-PAK 21 TAB) 10 MG (21) TBPK tablet    Sig: Take 6 tablets on day one, 5 on day two, 4 on day three, 3 on day four, 2 on day five, and 1 on day six. Take with food.    Dispense:  21 tablet    Refill:  0   albuterol (VENTOLIN HFA) 108 (90 Base) MCG/ACT inhaler    Sig: Inhale 2 puffs into the lungs every 6 (six) hours as needed for wheezing or shortness of breath.    Dispense:  8 g    Refill:  0      Providers prescribe antibiotics to treat infections caused by bacteria. Antibiotics are very powerful in treating bacterial infections when they are used properly. To maintain their effectiveness, they should be used only when necessary. Overuse of antibiotics has resulted in the development of superbugs that are resistant to treatment!    After careful review of your answers, I would not recommend an antibiotic for your condition.  Antibiotics are not effective against viruses and therefore should not be used to treat them. Common examples of infections caused by viruses include colds and flu  From your responses in the eVisit questionnaire you describe inflammation in the upper respiratory tract which is causing a significant cough.  This is commonly called Bronchitis and has four common causes:   Allergies Viral Infections Acid Reflux Bacterial  Infection Allergies, viruses and acid reflux are treated by controlling symptoms or eliminating the cause. An example might be a cough caused by taking certain blood pressure medications. You stop the cough by changing the medication. Another example might be a cough caused by acid reflux. Controlling the reflux helps control the cough.  USE OF BRONCHODILATOR ("RESCUE") INHALERS: There is a risk from using your bronchodilator too frequently.  The risk is that over-reliance on a medication which only relaxes the muscles surrounding the breathing tubes can reduce the effectiveness of medications prescribed to reduce swelling and congestion of the tubes themselves.  Although you feel brief relief from the bronchodilator inhaler, your asthma may actually be worsening with the tubes becoming more swollen and filled with mucus.  This can delay other crucial treatments, such as oral steroid medications. If you need to use a bronchodilator inhaler daily, several times per day, you should discuss this with your provider.  There are probably better treatments that could be used to keep your asthma under control.     HOME CARE Only take medications as instructed by your medical team. Complete the entire course of an antibiotic. Drink plenty of fluids and get plenty of rest. Avoid close contacts especially the very young and the elderly Cover your mouth if you cough or cough into your sleeve. Always remember to  wash your hands A steam or ultrasonic humidifier can help congestion.   GET HELP RIGHT AWAY IF: You develop worsening fever. You become short of breath You cough up blood. Your symptoms persist after you have completed your treatment plan MAKE SURE YOU  Understand these instructions. Will watch your condition. Will get help right away if you are not doing well or get worse.    Thank you for choosing an e-visit.  Your e-visit answers were reviewed by a board certified advanced clinical  practitioner to complete your personal care plan. Depending upon the condition, your plan could have included both over the counter or prescription medications.  Please review your pharmacy choice. Make sure the pharmacy is open so you can pick up prescription now. If there is a problem, you may contact your provider through [employer] and have the prescription routed to another pharmacy.  Your safety is important to [place]. If you have drug allergies check your prescription carefully.   For the next 24 hours you can use MyChart to ask questions about today's visit, request a non-urgent call back, or ask for a work or school excuse. You will get an email in the next two days asking about your experience. I hope that your e-visit has been valuable and will speed your recovery.   I spent approximately 5 minutes reviewing the patient's history, current symptoms and coordinating their care today.

## 2022-07-24 DIAGNOSIS — R197 Diarrhea, unspecified: Secondary | ICD-10-CM | POA: Diagnosis not present

## 2022-07-24 DIAGNOSIS — R112 Nausea with vomiting, unspecified: Secondary | ICD-10-CM | POA: Diagnosis not present

## 2022-07-24 DIAGNOSIS — Z6841 Body Mass Index (BMI) 40.0 and over, adult: Secondary | ICD-10-CM | POA: Diagnosis not present

## 2022-08-03 ENCOUNTER — Telehealth: Payer: BC Managed Care – PPO | Admitting: Nurse Practitioner

## 2022-08-03 DIAGNOSIS — J019 Acute sinusitis, unspecified: Secondary | ICD-10-CM

## 2022-08-03 DIAGNOSIS — B9689 Other specified bacterial agents as the cause of diseases classified elsewhere: Secondary | ICD-10-CM

## 2022-08-03 MED ORDER — AMOXICILLIN-POT CLAVULANATE 875-125 MG PO TABS: 1.0000 | ORAL_TABLET | Freq: Two times a day (BID) | ORAL | 0 refills | Status: AC

## 2022-08-03 NOTE — Progress Notes (Signed)

## 2022-08-03 NOTE — Progress Notes (Signed)
I have spent 5 minutes in review of e-visit questionnaire, review and updating patient chart, medical decision making and response to patient.  ° °Satomi Buda W Tanji Storrs, NP ° °  °

## 2022-09-08 IMAGING — DX DG LUMBAR SPINE COMPLETE 4+V
5 series · 5 of 5 positions shown · non-contrast
Comparison: CT October 21, 2018.

CLINICAL DATA: Lower back injury x2 years

EXAM:
LUMBAR SPINE - COMPLETE 4+ VIEW

[l-spine ap]
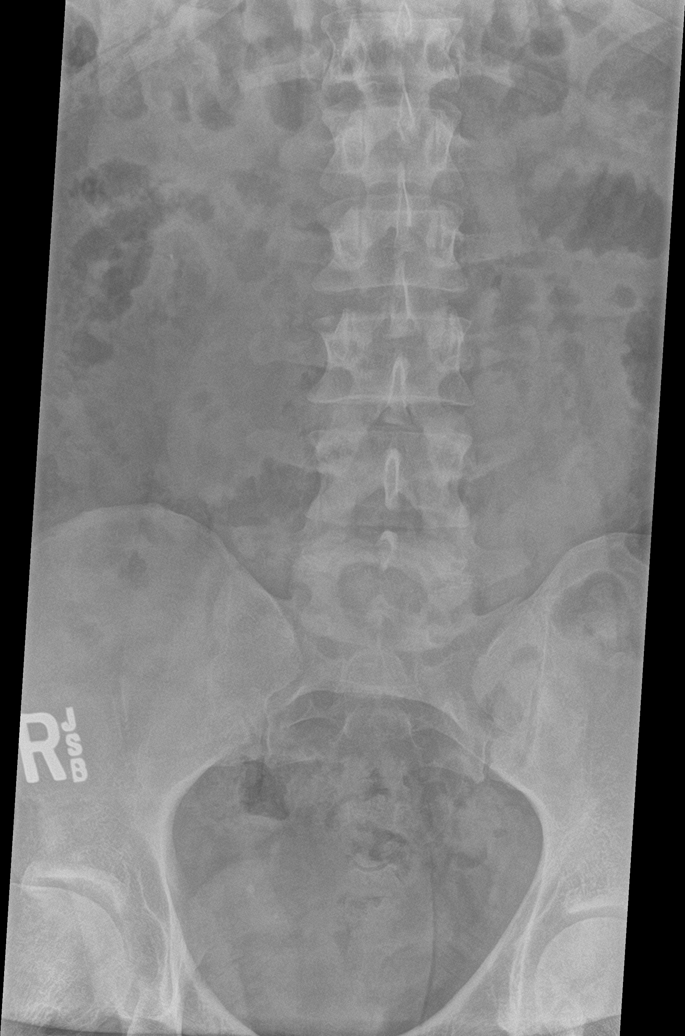

[l-spine obl (1 of 2)]
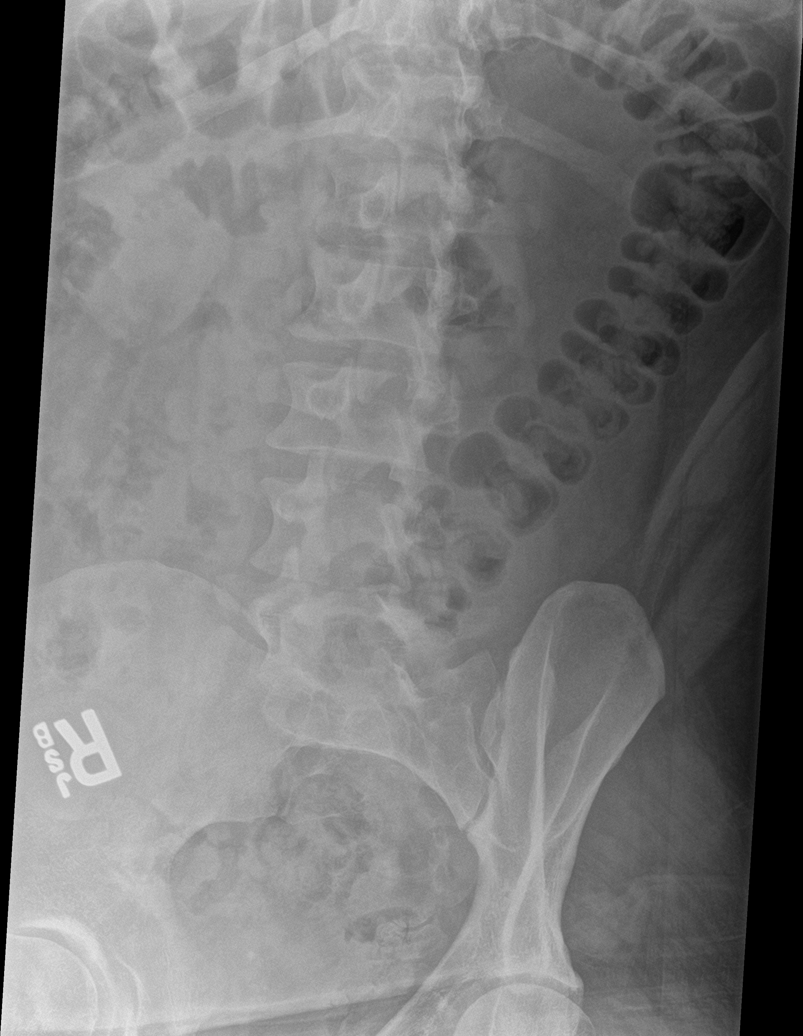

[l-spine obl (2 of 2)]
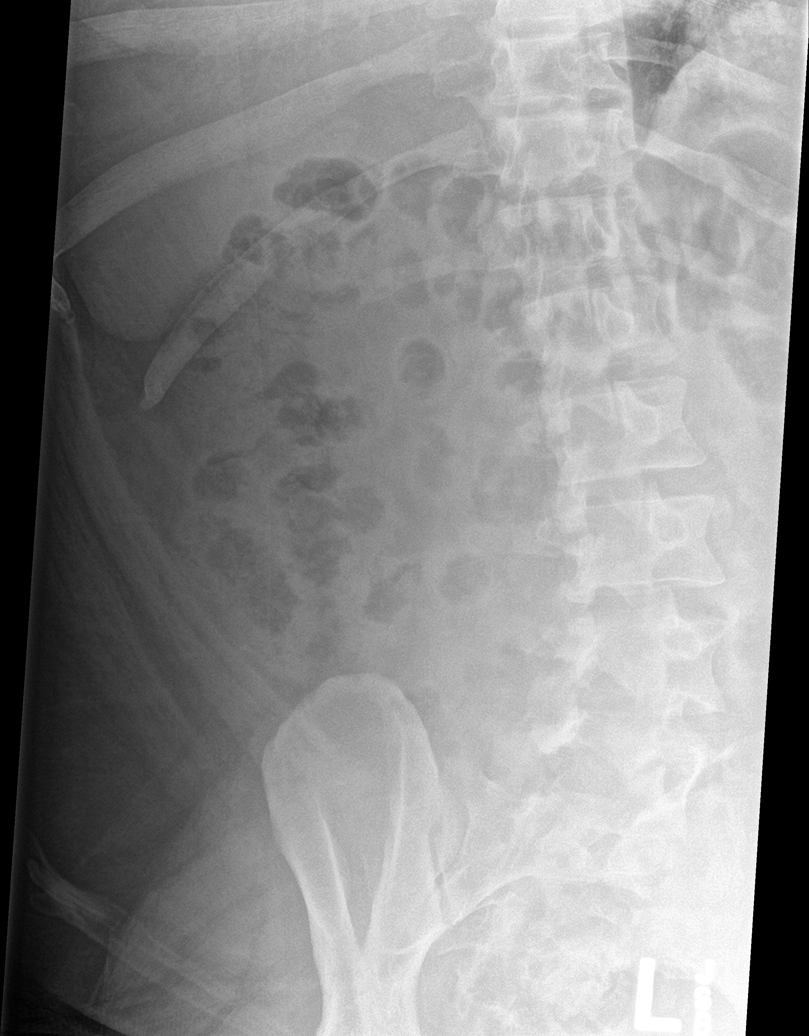

[l-spine lat]
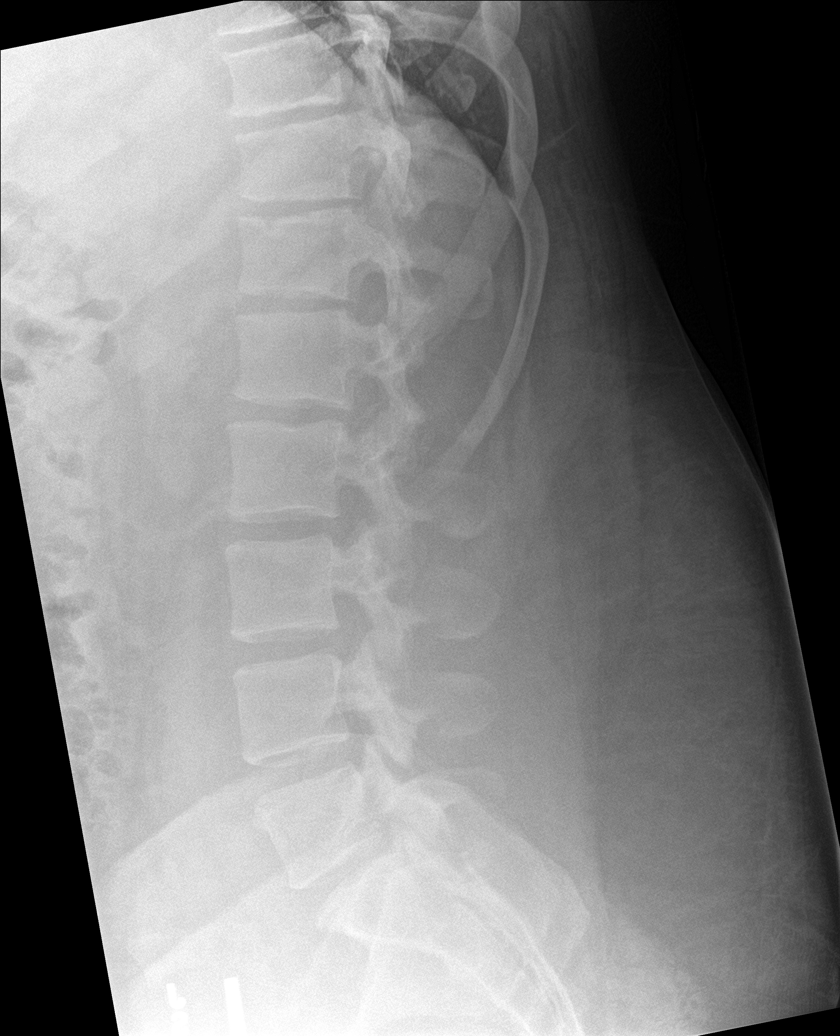

[l-spine spot]
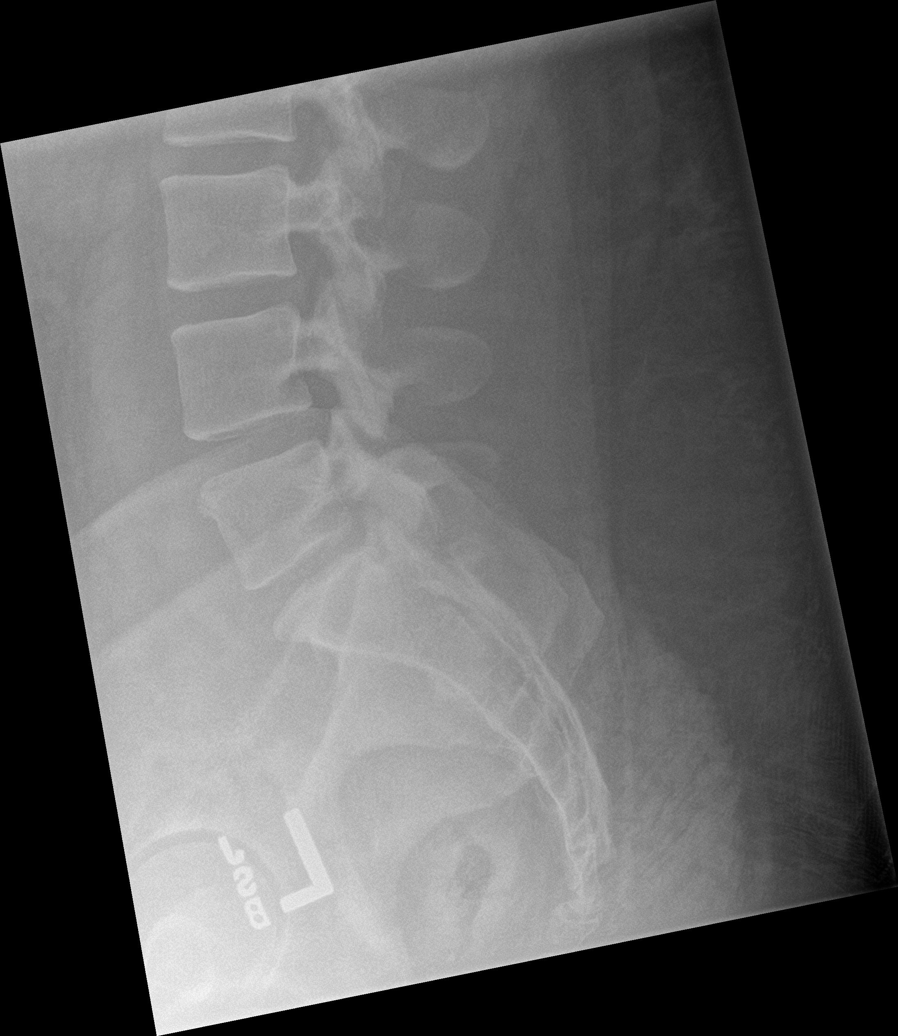

[5 of 5 positions shown; findings below may reference images not displayed]

FINDINGS: There is no evidence of lumbar spine fracture. Alignment is normal.
Lower thoracic endplate Schmorl's nodes are similar to CT October 21, 2018. Intervertebral disc spaces predominantly preserved.
IMPRESSION: No acute osseous abnormality.

Lower thoracic endplate Schmorl's nodes are similar to CT [DATE],

## 2023-01-13 ENCOUNTER — Telehealth: Payer: Self-pay | Admitting: Physician Assistant

## 2023-01-13 DIAGNOSIS — J019 Acute sinusitis, unspecified: Secondary | ICD-10-CM

## 2023-01-13 DIAGNOSIS — B9689 Other specified bacterial agents as the cause of diseases classified elsewhere: Secondary | ICD-10-CM

## 2023-01-13 MED ORDER — DOXYCYCLINE HYCLATE 100 MG PO TABS: 100.0000 mg | ORAL_TABLET | Freq: Two times a day (BID) | ORAL | 0 refills | Status: DC

## 2023-01-13 MED ORDER — AMOXICILLIN-POT CLAVULANATE 875-125 MG PO TABS: 1.0000 | ORAL_TABLET | Freq: Two times a day (BID) | ORAL | 0 refills | Status: DC

## 2023-01-13 NOTE — Addendum Note (Signed)
Addended by: [name] on: 01/13/2023 03:18 PM   Modules accepted: Orders

## 2023-01-13 NOTE — Progress Notes (Signed)
E-Visit for Sinus Problems  We are sorry that you are not feeling well.  Here is how we plan to help!  Based on what you have shared with me it looks like you have sinusitis.  Sinusitis is inflammation and infection in the sinus cavities of the head.  Based on your presentation I believe you most likely have Acute Bacterial Sinusitis.  This is an infection caused by bacteria and is treated with antibiotics. I have prescribed Augmentin 875mg/125mg one tablet twice daily with food, for 7 days. You may use an oral decongestant such as Mucinex D or if you have glaucoma or high blood pressure use plain Mucinex. Saline nasal spray help and can safely be used as often as needed for congestion.  If you develop worsening sinus pain, fever or notice severe headache and vision changes, or if symptoms are not better after completion of antibiotic, please schedule an appointment with a health care provider.    Sinus infections are not as easily transmitted as other respiratory infection, however we still recommend that you avoid close contact with loved ones, especially the very young and elderly.  Remember to wash your hands thoroughly throughout the day as this is the number one way to prevent the spread of infection!  Home Care: Only take medications as instructed by your medical team. Complete the entire course of an antibiotic. Do not take these medications with alcohol. A steam or ultrasonic humidifier can help congestion.  You can place a towel over your head and breathe in the steam from hot water coming from a faucet. Avoid close contacts especially the very young and the elderly. Cover your mouth when you cough or sneeze. Always remember to wash your hands.  Get Help Right Away If: You develop worsening fever or sinus pain. You develop a severe head ache or visual changes. Your symptoms persist after you have completed your treatment plan.  Make sure you Understand these instructions. Will watch  your condition. Will get help right away if you are not doing well or get worse.  Thank you for choosing an e-visit.  Your e-visit answers were reviewed by a board certified advanced clinical practitioner to complete your personal care plan. Depending upon the condition, your plan could have included both over the counter or prescription medications.  Please review your pharmacy choice. Make sure the pharmacy is open so you can pick up prescription now. If there is a problem, you may contact your provider through MyChart messaging and have the prescription routed to another pharmacy.  Your safety is important to us. If you have drug allergies check your prescription carefully.   For the next 24 hours you can use MyChart to ask questions about today's visit, request a non-urgent call back, or ask for a work or school excuse. You will get an email in the next two days asking about your experience. I hope that your e-visit has been valuable and will speed your recovery.  I have spent 5 minutes in review of e-visit questionnaire, review and updating patient chart, medical decision making and response to patient.   Jennifer M Burnette, PA-C  

## 2023-05-31 ENCOUNTER — Telehealth: Payer: Self-pay | Admitting: Nurse Practitioner

## 2023-05-31 DIAGNOSIS — B9689 Other specified bacterial agents as the cause of diseases classified elsewhere: Secondary | ICD-10-CM

## 2023-05-31 DIAGNOSIS — J019 Acute sinusitis, unspecified: Secondary | ICD-10-CM

## 2023-05-31 MED ORDER — DOXYCYCLINE HYCLATE 100 MG PO TABS: 100.0000 mg | ORAL_TABLET | Freq: Two times a day (BID) | ORAL | 0 refills | Status: DC

## 2023-05-31 NOTE — Progress Notes (Signed)
I have spent 5 minutes in review of e-visit questionnaire, review and updating patient chart, medical decision making and response to patient.  ° °Jerrell Mangel W Secilia Apps, NP ° °  °

## 2023-05-31 NOTE — Progress Notes (Signed)

## 2023-06-02 ENCOUNTER — Other Ambulatory Visit: Payer: Self-pay | Admitting: Nurse Practitioner

## 2023-06-02 DIAGNOSIS — J208 Acute bronchitis due to other specified organisms: Secondary | ICD-10-CM

## 2023-07-15 ENCOUNTER — Ambulatory Visit
Admission: RE | Admit: 2023-07-15 | Discharge: 2023-07-15 | Disposition: A | Discharge status: Home / Self Care | Payer: PRIVATE HEALTH INSURANCE | Source: Ambulatory Visit | Attending: Family Medicine

## 2023-07-15 ENCOUNTER — Telehealth: Payer: Self-pay | Admitting: Physician Assistant

## 2023-07-15 VITALS — BP 149/91 | HR 88 | Temp 98.7°F | Resp 16

## 2023-07-15 DIAGNOSIS — J329 Chronic sinusitis, unspecified: Secondary | ICD-10-CM

## 2023-07-15 DIAGNOSIS — J019 Acute sinusitis, unspecified: Secondary | ICD-10-CM | POA: Diagnosis not present

## 2023-07-15 DIAGNOSIS — B9789 Other viral agents as the cause of diseases classified elsewhere: Secondary | ICD-10-CM

## 2023-07-15 DIAGNOSIS — R0602 Shortness of breath: Secondary | ICD-10-CM

## 2023-07-15 DIAGNOSIS — R042 Hemoptysis: Secondary | ICD-10-CM

## 2023-07-15 LAB — POC COVID19/FLU A&B COMBO
Covid Antigen, POC: NEGATIVE
Influenza A Antigen, POC: NEGATIVE
Influenza B Antigen, POC: NEGATIVE

## 2023-07-15 MED ORDER — DEXAMETHASONE SODIUM PHOSPHATE 10 MG/ML IJ SOLN
10.0000 mg | Freq: Once | INTRAMUSCULAR | Status: AC
  Administered 2023-07-15: 10 mg via INTRAMUSCULAR

## 2023-07-15 MED ORDER — FLUTICASONE PROPIONATE 50 MCG/ACT NA SUSP: 1.0000 | Freq: Two times a day (BID) | NASAL | 2 refills | Status: DC

## 2023-07-15 NOTE — Progress Notes (Signed)
  Because of shortness of breath and blood in sputum requiring need for examination, I feel your condition warrants further evaluation and I recommend that you be seen in a face-to-face visit.   NOTE: There will be NO CHARGE for this E-Visit   If you are having a true medical emergency, please call 911.     For an urgent face to face visit, [facility] has multiple urgent care centers for your convenience.  Click the link below for the full list of locations and hours, walk-in wait times, appointment scheduling options and driving directions:  Urgent Care - [place], [place], [place], [place], [place], [place]  [place]     Your MyChart E-visit questionnaire answers were reviewed by a board certified advanced clinical practitioner to complete your personal care plan based on your specific symptoms.    Thank you for using e-Visits.

## 2023-07-15 NOTE — ED Triage Notes (Signed)
 Pt reports he ha nasal pressure, mucus, and low energy x 2 days

## 2023-07-18 ENCOUNTER — Telehealth: Payer: PRIVATE HEALTH INSURANCE | Admitting: Physician Assistant

## 2023-07-18 DIAGNOSIS — B9689 Other specified bacterial agents as the cause of diseases classified elsewhere: Secondary | ICD-10-CM

## 2023-07-18 DIAGNOSIS — J019 Acute sinusitis, unspecified: Secondary | ICD-10-CM

## 2023-07-18 MED ORDER — AZITHROMYCIN 250 MG PO TABS: ORAL_TABLET | ORAL | 0 refills | Status: AC

## 2023-07-18 NOTE — Patient Instructions (Signed)
 Gregory Fleming, thank you for joining Gregory Loveless, PA-C for today's virtual visit.  While this provider is not your primary care provider (PCP), if your PCP is located in our provider database this encounter information will be shared with them immediately following your visit.   A [facility] MyChart account gives you access to today's visit and all your visits, tests, and labs performed at [facility] " click here if you don't have a Gregory Fleming MyChart account or go to [email or web address]  Consent: (Patient) Gregory Fleming provided verbal consent for this virtual visit at the beginning of the encounter.  Current Medications:  Current Outpatient Medications:    azithromycin (ZITHROMAX) 250 MG tablet, Take 2 tablets on day 1, then 1 tablet daily on days 2 through 5, Disp: 6 tablet, Rfl: 0   acetaminophen (TYLENOL) 500 MG tablet, Take 500 mg by mouth every 6 (six) hours as needed for moderate pain or headache., Disp: , Rfl:    albuterol (VENTOLIN HFA) 108 (90 Base) MCG/ACT inhaler, Inhale 2 puffs into the lungs every 6 (six) hours as needed for wheezing or shortness of breath., Disp: 8 g, Rfl: 0   buPROPion (WELLBUTRIN XL) 150 MG 24 hr tablet, Take 150 mg by mouth daily., Disp: , Rfl:    doxycycline (VIBRA-TABS) 100 MG tablet, Take 1 tablet (100 mg total) by mouth 2 (two) times daily., Disp: 20 tablet, Rfl: 0   fluticasone (FLONASE) 50 MCG/ACT nasal spray, Place 1 spray into both nostrils 2 (two) times daily., Disp: 16 g, Rfl: 2   ibuprofen (ADVIL) 200 MG tablet, Take 600 mg by mouth every 6 (six) hours as needed for headache or moderate pain., Disp: , Rfl:    imiquimod (ALDARA) 5 % cream, Apply topically 3 (three) times a week., Disp: , Rfl:    omeprazole (PRILOSEC) 20 MG capsule, Take 1 capsule (20 mg total) by mouth daily., Disp: 30 capsule, Rfl: 0   Medications ordered in this encounter:  Meds ordered this encounter  Medications   azithromycin (ZITHROMAX)  250 MG tablet    Sig: Take 2 tablets on day 1, then 1 tablet daily on days 2 through 5    Dispense:  6 tablet    Refill:  0    Supervising Provider:   Merrilee Jansky [phone]     *If you need refills on other medications prior to your next appointment, please contact your pharmacy*  Follow-Up: Call back or seek an in-person evaluation if the symptoms worsen or if the condition fails to improve as anticipated.  [facility] [phone]  Other Instructions  Sinus Infection, Adult A sinus infection, also called sinusitis, is inflammation of your sinuses. Sinuses are hollow spaces in the bones around your face. Your sinuses are located: Around your eyes. In the middle of your forehead. Behind your nose. In your cheekbones. Mucus normally drains out of your sinuses. When your nasal tissues become inflamed or swollen, mucus can become trapped or blocked. This allows bacteria, viruses, and fungi to grow, which leads to infection. Most infections of the sinuses are caused by a virus. A sinus infection can develop quickly. It can last for up to 4 weeks (acute) or for more than 12 weeks (chronic). A sinus infection often develops after a cold. What are the causes? This condition is caused by anything that creates swelling in the sinuses or stops mucus from draining. This includes: Allergies. Asthma. Infection from bacteria or viruses. Deformities or  blockages in your nose or sinuses. Abnormal growths in the nose (nasal polyps). Pollutants, such as chemicals or irritants in the air. Infection from fungi. This is rare. What increases the risk? You are more likely to develop this condition if you: Have a weak body defense system (immune system). Do a lot of swimming or diving. Overuse nasal sprays. Smoke. What are the signs or symptoms? The main symptoms of this condition are pain and a feeling of pressure around the affected sinuses. Other symptoms include: Stuffy nose  or congestion that makes it difficult to breathe through your nose. Thick yellow or greenish drainage from your nose. Tenderness, swelling, and warmth over the affected sinuses. A cough that may get worse at night. Decreased sense of smell and taste. Extra mucus that collects in the throat or the back of the nose (postnasal drip) causing a sore throat or bad breath. Tiredness (fatigue). Fever. How is this diagnosed? This condition is diagnosed based on: Your symptoms. Your medical history. A physical exam. Tests to find out if your condition is acute or chronic. This may include: Checking your nose for nasal polyps. Viewing your sinuses using a device that has a light (endoscope). Testing for allergies or bacteria. Imaging tests, such as an MRI or CT scan. In rare cases, a bone biopsy may be done to rule out more serious types of fungal sinus disease. How is this treated? Treatment for a sinus infection depends on the cause and whether your condition is chronic or acute. If caused by a virus, your symptoms should go away on their own within 10 days. You may be given medicines to relieve symptoms. They include: Medicines that shrink swollen nasal passages (decongestants). A spray that eases inflammation of the nostrils (topical intranasal corticosteroids). Rinses that help get rid of thick mucus in your nose (nasal saline washes). Medicines that treat allergies (antihistamines). Over-the-counter pain relievers. If caused by bacteria, your health care provider may recommend waiting to see if your symptoms improve. Most bacterial infections will get better without antibiotic medicine. You may be given antibiotics if you have: A severe infection. A weak immune system. If caused by narrow nasal passages or nasal polyps, surgery may be needed. Follow these instructions at home: Medicines Take, use, or apply over-the-counter and prescription medicines only as told by your health care  provider. These may include nasal sprays. If you were prescribed an antibiotic medicine, take it as told by your health care provider. Do not stop taking the antibiotic even if you start to feel better. Hydrate and humidify  Drink enough fluid to keep your urine pale yellow. Staying hydrated will help to thin your mucus. Use a cool mist humidifier to keep the humidity level in your home above 50%. Inhale steam for 10-15 minutes, 3-4 times a day, or as told by your health care provider. You can do this in the bathroom while a hot shower is running. Limit your exposure to cool or dry air. Rest Rest as much as possible. Sleep with your head raised (elevated). Make sure you get enough sleep each night. General instructions  Apply a warm, moist washcloth to your face 3-4 times a day or as told by your health care provider. This will help with discomfort. Use nasal saline washes as often as told by your health care provider. Wash your hands often with soap and water to reduce your exposure to germs. If soap and water are not available, use hand sanitizer. Do not smoke. Avoid being  around people who are smoking (secondhand smoke). Keep all follow-up visits. This is important. Contact a health care provider if: You have a fever. Your symptoms get worse. Your symptoms do not improve within 10 days. Get help right away if: You have a severe headache. You have persistent vomiting. You have severe pain or swelling around your face or eyes. You have vision problems. You develop confusion. Your neck is stiff. You have trouble breathing. These symptoms may be an emergency. Get help right away. Call 911. Do not wait to see if the symptoms will go away. Do not drive yourself to the hospital. Summary A sinus infection is soreness and inflammation of your sinuses. Sinuses are hollow spaces in the bones around your face. This condition is caused by nasal tissues that become inflamed or swollen. The  swelling traps or blocks the flow of mucus. This allows bacteria, viruses, and fungi to grow, which leads to infection. If you were prescribed an antibiotic medicine, take it as told by your health care provider. Do not stop taking the antibiotic even if you start to feel better. Keep all follow-up visits. This is important. This information is not intended to replace advice given to you by your health care provider. Make sure you discuss any questions you have with your health care provider. Document Revised: 04/03/2021 Document Reviewed: 04/03/2021 Elsevier Patient Education  2024 Elsevier Inc.   If you have been instructed to have an in-person evaluation today at a local Urgent Care facility, please use the link below. It will take you to a list of all of our available [facility], including address, phone number and hours of operation. Please do not delay care.  [facility]  If you or a family member do not have a primary care provider, use the link below to schedule a visit and establish care. When you choose a [facility] primary care physician or advanced practice provider, you gain a long-term partner in health. Find a Primary Care Provider  Learn more about [facility] in-office and virtual care options: [facility] - Get Care Now

## 2023-07-18 NOTE — Progress Notes (Signed)
 Virtual Visit Consent   Gregory Fleming, you are scheduled for a virtual visit with a [facility] provider today. Just as with appointments in the office, your consent must be obtained to participate. Your consent will be active for this visit and any virtual visit you may have with one of our providers in the next 365 days. If you have a MyChart account, a copy of this consent can be sent to you electronically.  As this is a virtual visit, video technology does not allow for your provider to perform a traditional examination. This may limit your provider's ability to fully assess your condition. If your provider identifies any concerns that need to be evaluated in person or the need to arrange testing (such as labs, EKG, etc.), we will make arrangements to do so. Although advances in technology are sophisticated, we cannot ensure that it will always work on either your end or our end. If the connection with a video visit is poor, the visit may have to be switched to a telephone visit. With either a video or telephone visit, we are not always able to ensure that we have a secure connection.  By engaging in this virtual visit, you consent to the provision of healthcare and authorize for your insurance to be billed (if applicable) for the services provided during this visit. Depending on your insurance coverage, you may receive a charge related to this service.  I need to obtain your verbal consent now. Are you willing to proceed with your visit today? Gregory Fleming has provided verbal consent on 07/18/2023 for a virtual visit (video or telephone). [name], PA-C  Date: 07/18/2023 10:51 AM   Virtual Visit via Video Note   I, [name], connected with  Gregory Fleming  ([id number], 08-22-1997) on 07/18/23 at 10:45 AM EST by a video-enabled telemedicine application and verified that I am speaking with the correct person using two identifiers.  Location: Patient: Virtual Visit  Location Patient: Home Provider: Virtual Visit Location Provider: Home Office   I discussed the limitations of evaluation and management by telemedicine and the availability of in person appointments. The patient expressed understanding and agreed to proceed.    History of Present Illness: Gregory Fleming is a 28 y.o. who identifies as a male who was assigned male at birth, and is being seen today for continued sinus congestion.  HPI: Sinusitis This is a new problem. The current episode started in the past 7 days (Seen in person at [facility] on 07/15/23 and diagnosed with Viral SInusitis; Symptoms have continued to worsen; Symptoms worsened on Monday from chronic sinusitis). The problem has been gradually worsening since onset. The maximum temperature recorded prior to his arrival was 100.4 - 100.9 F (100.3 just today). The fever has been present for Less than 1 day. Associated symptoms include congestion, coughing (dry), headaches and sinus pressure. Pertinent negatives include no chills, ear pain, hoarse voice, shortness of breath or sore throat. Treatments tried: flonase, mucinex sinus, dayquil, nyquil. The treatment provided no relief.   Covid + Flu were [facility] at 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   Congestion of throat 06/04/2021   Dyspnea 06/04/2021   Upper respiratory infection 06/04/2021   Congestion of nasal sinus 03/06/2021   Gastroesophageal reflux disease without esophagitis 03/06/2021   Acute maxillary sinusitis 12/05/2020   Cough 12/05/2020  Pain in joint of left knee 10/24/2020   Pain in right foot 10/24/2020   Vasectomy evaluation 03/20/2020   Umbilical hernia without obstruction and without gangrene     Allergies:  Allergies  Allergen Reactions   Amoxicillin Rash and Other (See Comments)    Chest tightness   Benzonatate Other (See Comments)   Doxycycline      "Very bad stomach cramps"   Medications:  Current Outpatient Medications:    azithromycin (ZITHROMAX) 250 MG tablet, Take 2 tablets on day 1, then 1 tablet daily on days 2 through 5, Disp: 6 tablet, Rfl: 0   acetaminophen (TYLENOL) 500 MG tablet, Take 500 mg by mouth every 6 (six) hours as needed for moderate pain or headache., Disp: , Rfl:    albuterol (VENTOLIN HFA) 108 (90 Base) MCG/ACT inhaler, Inhale 2 puffs into the lungs every 6 (six) hours as needed for wheezing or shortness of breath., Disp: 8 g, Rfl: 0   buPROPion (WELLBUTRIN XL) 150 MG 24 hr tablet, Take 150 mg by mouth daily., Disp: , Rfl:    doxycycline (VIBRA-TABS) 100 MG tablet, Take 1 tablet (100 mg total) by mouth 2 (two) times daily., Disp: 20 tablet, Rfl: 0   fluticasone (FLONASE) 50 MCG/ACT nasal spray, Place 1 spray into both nostrils 2 (two) times daily., Disp: 16 g, Rfl: 2   ibuprofen (ADVIL) 200 MG tablet, Take 600 mg by mouth every 6 (six) hours as needed for headache or moderate pain., Disp: , Rfl:    imiquimod (ALDARA) 5 % cream, Apply topically 3 (three) times a week., Disp: , Rfl:    omeprazole (PRILOSEC) 20 MG capsule, Take 1 capsule (20 mg total) by mouth daily., Disp: 30 capsule, Rfl: 0  Observations/Objective: Patient is well-developed, well-nourished in no acute distress.  Resting comfortably at home.  Head is normocephalic, atraumatic.  No labored breathing.  Speech is clear and coherent with logical content.  Patient is alert and oriented at baseline.    Assessment and Plan: 1. Acute bacterial sinusitis (Primary) - azithromycin (ZITHROMAX) 250 MG tablet; Take 2 tablets on day 1, then 1 tablet daily on days 2 through 5  Dispense: 6 tablet; Refill: 0  - Worsening symptoms that have not responded to OTC medications.  - Will give Azithromycin - Continue allergy medications.  - Steam and humidifier can help - Stay well hydrated and get plenty of rest.  - Seek in person evaluation if no symptom  improvement or if symptoms worsen   Follow Up Instructions: I discussed the assessment and treatment plan with the patient. The patient was provided an opportunity to ask questions and all were answered. The patient agreed with the plan and demonstrated an understanding of the instructions.  A copy of instructions were sent to the patient via MyChart unless otherwise noted below.    The patient was advised to call back or seek an in-person evaluation if the symptoms worsen or if the condition fails to improve as anticipated.    [name], PA-C

## 2023-07-19 NOTE — ED Provider Notes (Signed)
 [facility]    CSN: [id number] Arrival date & time: 07/15/23  1555      History   Chief Complaint Chief Complaint  Patient presents with   Nasal Congestion    Face pressure, nose stopped up when i do blow it blood comes out, tired, cough, spitting up mucus, tried Flonase and DayQuil/nyquil - Entered by patient    HPI Gregory Fleming is a 28 y.o. male.   Presenting today with 2 day history of congestion, fatigue, headache, sinus pressure. Denies fever, CP, SOB, abdominal pain, N/V/D. So far trying OTC remedies with minimal relief.     Past Medical History:  Diagnosis Date   Anxiety    Herpes    Knee pain     Patient Active Problem List   Diagnosis Date Noted   Prolapsed lumbar disc 10/29/2021   Schmorl's nodes of lumbar region 10/29/2021   Congestion of throat 06/04/2021   Dyspnea 06/04/2021   Upper respiratory infection 06/04/2021   Congestion of nasal sinus 03/06/2021   Gastroesophageal reflux disease without esophagitis 03/06/2021   Acute maxillary sinusitis 12/05/2020   Cough 12/05/2020   Pain in joint of left knee 10/24/2020   Pain in right foot 10/24/2020   Vasectomy evaluation 03/20/2020   Umbilical hernia without obstruction and without gangrene     Past Surgical History:  Procedure Laterality Date   UMBILICAL HERNIA REPAIR N/A 11/30/2018   Procedure: UMBILICAL HERNIA REPAIR;  Surgeon: Franky Macho, MD;  Location: [facility];  Service: General;  Laterality: N/A;       Home Medications    Prior to Admission medications   Medication Sig Start Date End Date Taking? Authorizing Provider  fluticasone (FLONASE) 50 MCG/ACT nasal spray Place 1 spray into both nostrils 2 (two) times daily. 07/15/23  Yes Particia Nearing, PA-C  acetaminophen (TYLENOL) 500 MG tablet Take 500 mg by mouth every 6 (six) hours as needed for moderate pain or headache.    [provider]  albuterol (VENTOLIN HFA) 108 (90 Base) MCG/ACT inhaler Inhale 2 puffs  into the lungs every 6 (six) hours as needed for wheezing or shortness of breath. 05/13/22   Viviano Simas, FNP  azithromycin (ZITHROMAX) 250 MG tablet Take 2 tablets on day 1, then 1 tablet daily on days 2 through 5 07/18/23 07/23/23  [name], PA-C  buPROPion (WELLBUTRIN XL) 150 MG 24 hr tablet Take 150 mg by mouth daily. 03/03/20   [provider]  doxycycline (VIBRA-TABS) 100 MG tablet Take 1 tablet (100 mg total) by mouth 2 (two) times daily. 05/31/23   Claiborne Rigg, NP  ibuprofen (ADVIL) 200 MG tablet Take 600 mg by mouth every 6 (six) hours as needed for headache or moderate pain.    [provider]  imiquimod (ALDARA) 5 % cream Apply topically 3 (three) times a week. 03/03/20   [provider]  omeprazole (PRILOSEC) 20 MG capsule Take 1 capsule (20 mg total) by mouth daily. 09/14/19   Moshe Cipro, FNP    Family History History reviewed. No pertinent family history.  Social History Social History   Tobacco Use   Smoking status: Every Day    Current packs/day: 1.00    Average packs/day: 1 pack/day for 8.0 years (8.0 ttl pk-yrs)    Types: Cigarettes   Smokeless tobacco: Current    Types: Snuff  Vaping Use   Vaping status: Never Used  Substance Use Topics   Alcohol use: Yes    Alcohol/week:  7.0 standard drinks of alcohol    Types: 7 Shots of liquor per week    Comment: occ   Drug use: No     Allergies   Amoxicillin, Benzonatate, and Doxycycline   Review of Systems Review of Systems PER HPI  Physical Exam Triage Vital Signs ED Triage Vitals  Encounter Vitals Group     BP 07/15/23 1638 (!) 149/91     Systolic BP Percentile --      Diastolic BP Percentile --      Pulse Rate 07/15/23 1638 88     Resp 07/15/23 1638 16     Temp 07/15/23 1638 98.7 F (37.1 C)     Temp Source 07/15/23 1638 Oral     SpO2 07/15/23 1638 95 %     Weight --      Height --      Head Circumference --      Peak Flow --      Pain Score 07/15/23  1637 4     Pain Loc --      Pain Education --      Exclude from Growth Chart --    No data found.  Updated Vital Signs BP (!) 149/91 (BP Location: Right Wrist)   Pulse 88   Temp 98.7 F (37.1 C) (Oral)   Resp 16   SpO2 95%   Visual Acuity Right Eye Distance:   Left Eye Distance:   Bilateral Distance:    Right Eye Near:   Left Eye Near:    Bilateral Near:     Physical Exam   UC Treatments / Results  Labs (all labs ordered are listed, but only abnormal results are displayed) Labs Reviewed  POC COVID19/FLU A&B COMBO    EKG   Radiology No results found.  Procedures Procedures (including critical care time)  Medications Ordered in UC Medications  dexamethasone (DECADRON) injection 10 mg (10 mg Intramuscular Given 07/15/23 1746)    Initial Impression / Assessment and Plan / UC Course  I have reviewed the triage vital signs and the nursing notes.  Pertinent labs & imaging results that were available during my care of the patient were reviewed by me and considered in my medical decision making (see chart for details).     Rapid flu and COVID negative, will treat for viral sinusitis with IM decadron, flonase, supportive OTC medications and home care. Return for worsening sxs.  Final Clinical Impressions(s) / UC Diagnoses   Final diagnoses:  Viral sinusitis   Discharge Instructions   None    ED Prescriptions     Medication Sig Dispense Auth. Provider   fluticasone (FLONASE) 50 MCG/ACT nasal spray Place 1 spray into both nostrils 2 (two) times daily. 16 g Particia Nearing, [place]      PDMP not reviewed this encounter.   Particia Nearing, [place] 07/19/23 2306

## 2024-02-27 ENCOUNTER — Ambulatory Visit
Admission: RE | Admit: 2024-02-27 | Discharge: 2024-02-27 | Disposition: A | Discharge status: Home / Self Care | Payer: PRIVATE HEALTH INSURANCE | Attending: Family Medicine

## 2024-02-27 ENCOUNTER — Ambulatory Visit (INDEPENDENT_AMBULATORY_CARE_PROVIDER_SITE_OTHER): Payer: PRIVATE HEALTH INSURANCE

## 2024-02-27 VITALS — BP 130/80 | HR 73 | Temp 98.5°F | Resp 20

## 2024-02-27 DIAGNOSIS — M25562 Pain in left knee: Secondary | ICD-10-CM

## 2024-02-27 MED ORDER — DEXAMETHASONE SOD PHOSPHATE PF 10 MG/ML IJ SOLN
10.0000 mg | Freq: Once | INTRAMUSCULAR | Status: AC
  Administered 2024-02-27: 10 mg via INTRAMUSCULAR

## 2024-02-27 NOTE — ED Triage Notes (Signed)
 Left knee pain x 3 weeks.  States pain has progressively gotten worse.  States he fell at work 3 weeks ago and landed on hip, does not remember hitting knee.

## 2024-02-27 NOTE — Discharge Instructions (Signed)
 Rest, ice, compression, elevation, over-the-counter pain relievers as needed.  We have also given you a steroid shot today for pain and inflammation.  Follow-up with orthopedics if worsening or not improving.

## 2024-02-27 NOTE — ED Provider Notes (Signed)
 [facility]    CSN: [id number] Arrival date & time: 02/27/24  9047      History   Chief Complaint Chief Complaint  Patient presents with   Knee Pain    Entered by patient    HPI Gregory Fleming is a 28 y.o. male.   Patient presenting today with 1.5 weeks of left anterior knee pain, swelling off-and-on.  States he fell about 3 weeks ago related on his hip but does not recall injuring his knee in this fall.  Denies loss of range of motion, numbness, tingling, discoloration.  So far trying over-the-counter remedies with minimal relief.    Past Medical History:  Diagnosis Date   Anxiety    Herpes    Knee pain     Patient Active Problem List   Diagnosis Date Noted   Prolapsed lumbar disc 10/29/2021   Schmorl's nodes of lumbar region 10/29/2021   Congestion of throat 06/04/2021   Dyspnea 06/04/2021   Upper respiratory infection 06/04/2021   Congestion of nasal sinus 03/06/2021   Gastroesophageal reflux disease without esophagitis 03/06/2021   Acute maxillary sinusitis 12/05/2020   Cough 12/05/2020   Pain in joint of left knee 10/24/2020   Pain in right foot 10/24/2020   Vasectomy evaluation 03/20/2020   Umbilical hernia without obstruction and without gangrene     Past Surgical History:  Procedure Laterality Date   UMBILICAL HERNIA REPAIR N/A 11/30/2018   Procedure: UMBILICAL HERNIA REPAIR;  Surgeon: Mavis Anes, MD;  Location: [facility];  Service: General;  Laterality: N/A;       Home Medications    Prior to Admission medications   Medication Sig Start Date End Date Taking? Authorizing Provider  acetaminophen  (TYLENOL ) 500 MG tablet Take 500 mg by mouth every 6 (six) hours as needed for moderate pain or headache.    [provider]  fluticasone  (FLONASE ) 50 MCG/ACT nasal spray Place 1 spray into both nostrils 2 (two) times daily. 07/15/23   Stuart Vernell Norris, PA-C  ibuprofen  (ADVIL ) 200 MG tablet Take 600 mg by mouth every 6 (six)  hours as needed for headache or moderate pain.    [provider]  imiquimod (ALDARA) 5 % cream Apply topically 3 (three) times a week. 03/03/20   [provider]    Family History History reviewed. No pertinent family history.  Social History Social History   Tobacco Use   Smoking status: Every Day    Current packs/day: 1.00    Average packs/day: 1 pack/day for 8.0 years (8.0 ttl pk-yrs)    Types: Cigarettes   Smokeless tobacco: Current    Types: Snuff  Vaping Use   Vaping status: Never Used  Substance Use Topics   Alcohol use: Yes    Alcohol/week: 7.0 standard drinks of alcohol    Types: 7 Shots of liquor per week    Comment: occ   Drug use: No     Allergies   Amoxicillin , Benzonatate, and Doxycycline    Review of Systems Review of Systems Per HPI  Physical Exam Triage Vital Signs ED Triage Vitals  Encounter Vitals Group     BP 02/27/24 1001 130/80     Girls Systolic BP Percentile --      Girls Diastolic BP Percentile --      Boys Systolic BP Percentile --      Boys Diastolic BP Percentile --      Pulse Rate 02/27/24 1001 73     Resp 02/27/24 1001 20  Temp 02/27/24 1001 98.5 F (36.9 C)     Temp Source 02/27/24 1001 Oral     SpO2 02/27/24 1001 97 %     Weight --      Height --      Head Circumference --      Peak Flow --      Pain Score 02/27/24 1003 6     Pain Loc --      Pain Education --      Exclude from Growth Chart --    No data found.  Updated Vital Signs BP 130/80 (BP Location: Right Arm)   Pulse 73   Temp 98.5 F (36.9 C) (Oral)   Resp 20   SpO2 97%   Visual Acuity Right Eye Distance:   Left Eye Distance:   Bilateral Distance:    Right Eye Near:   Left Eye Near:    Bilateral Near:     Physical Exam Vitals and nursing note reviewed.  Constitutional:      Appearance: Normal appearance.  HENT:     Head: Atraumatic.  Eyes:     Extraocular Movements: Extraocular movements intact.     Conjunctiva/sclera:  Conjunctivae normal.  Cardiovascular:     Rate and Rhythm: Normal rate.  Pulmonary:     Effort: Pulmonary effort is normal.  Musculoskeletal:        General: Tenderness present. No swelling. Normal range of motion.     Cervical back: Normal range of motion and neck supple.     Comments: Tenderness to palpation to left anterior knee, range of motion intact but painful in all directions.  No joint laxity on exam.  Weightbearing without difficulty  Skin:    General: Skin is warm and dry.     Findings: No bruising or erythema.  Neurological:     Mental Status: He is oriented to person, place, and time.     Comments: Left lower extremity neurovascularly intact  Psychiatric:        Mood and Affect: Mood normal.        Thought Content: Thought content normal.        Judgment: Judgment normal.      UC Treatments / Results  Labs (all labs ordered are listed, but only abnormal results are displayed) Labs Reviewed - No data to display  EKG   Radiology DG Knee Complete 4 Views Left Result Date: 02/27/2024 EXAM: 4 VIEW(S) XRAY OF THE LEFT KNEE 02/27/2024 10:31:23 AM COMPARISON: Left knee series dated 10/24/2020. CLINICAL HISTORY: left anterior knee pain x 1.5 weeks FINDINGS: BONES AND JOINTS: No acute fracture. Partially visualized benign-appearing lesion within the distal femoral diaphysis. No joint dislocation. No significant joint effusion. No significant degenerative changes. SOFT TISSUES: The soft tissues are unremarkable. IMPRESSION: 1. No acute findings. 2. Stable, partially visualized benign-appearing lesion within the distal femoral diaphysis. Electronically signed by: Evalene Coho MD 02/27/2024 10:38 AM EDT RP Workstation: [id number]    Procedures Procedures (including critical care time)  Medications Ordered in UC Medications  dexamethasone  (DECADRON ) injection 10 mg (10 mg Intramuscular Given 02/27/24 1047)    Initial Impression / Assessment and Plan / UC Course  I  have reviewed the triage vital signs and the nursing notes.  Pertinent labs & imaging results that were available during my care of the patient were reviewed by me and considered in my medical decision making (see chart for details).     X-ray of the left knee without acute bony abnormality, suspect soft  tissue strain.  Treat with IM Decadron , RICE, over-the-counter pain relievers, Ortho follow-up if not resolving.  Return for worsening symptoms.  Final Clinical Impressions(s) / UC Diagnoses   Final diagnoses:  Acute pain of left knee     Discharge Instructions      Rest, ice, compression, elevation, over-the-counter pain relievers as needed.  We have also given you a steroid shot today for pain and inflammation.  Follow-up with orthopedics if worsening or not improving.    ED Prescriptions   None    PDMP not reviewed this encounter.   Stuart Vernell Norris, [place] 02/27/24 1103

## 2024-04-23 ENCOUNTER — Telehealth: Payer: PRIVATE HEALTH INSURANCE | Admitting: Family Medicine

## 2024-04-23 ENCOUNTER — Ambulatory Visit: Payer: PRIVATE HEALTH INSURANCE | Admitting: Orthopedic Surgery

## 2024-04-23 DIAGNOSIS — B9789 Other viral agents as the cause of diseases classified elsewhere: Secondary | ICD-10-CM | POA: Diagnosis not present

## 2024-04-23 DIAGNOSIS — J329 Chronic sinusitis, unspecified: Secondary | ICD-10-CM

## 2024-04-23 MED ORDER — FLUTICASONE PROPIONATE 50 MCG/ACT NA SUSP: 1.0000 | Freq: Two times a day (BID) | NASAL | 2 refills | Status: AC

## 2024-04-23 MED ORDER — FLUTICASONE PROPIONATE 50 MCG/ACT NA SUSP: 2.0000 | Freq: Every day | NASAL | 6 refills | Status: AC

## 2024-04-23 NOTE — Progress Notes (Signed)
 We are sorry that you are not feeling well.  Here is how we plan to help!  Based on what you have shared with me it looks like you have sinusitis.  Sinusitis is inflammation and infection in the sinus cavities of the head.  Based on your presentation I believe you most likely have Acute Viral Sinusitis.This is an infection most likely caused by a virus. There is not specific treatment for viral sinusitis other than to help you with the symptoms until the infection runs its course.  You may use an oral decongestant such as Mucinex D or if you have glaucoma or high blood pressure use plain Mucinex. Saline nasal spray help and can safely be used as often as needed for congestion, I have prescribed: Fluticasone nasal spray two sprays in each nostril once a day  Some authorities believe that zinc sprays or the use of Echinacea may shorten the course of your symptoms.  Sinus infections are not as easily transmitted as other respiratory infection, however we still recommend that you avoid close contact with loved ones, especially the very young and elderly.  Remember to wash your hands thoroughly throughout the day as this is the number one way to prevent the spread of infection!  Home Care: Only take medications as instructed by your medical team. Do not take these medications with alcohol. A steam or ultrasonic humidifier can help congestion.  You can place a towel over your head and breathe in the steam from hot water  coming from a faucet. Avoid close contacts especially the very young and the elderly. Cover your mouth when you cough or sneeze. Always remember to wash your hands.  Get Help Right Away If: You develop worsening fever or sinus pain. You develop a severe head ache or visual changes. Your symptoms persist after you have completed your treatment plan.  Make sure you Understand these instructions. Will watch your condition. Will get help right away if you are not doing well or get  worse.  Your e-visit answers were reviewed by a board certified advanced clinical practitioner to complete your personal care plan.  Depending on the condition, your plan could have included both over the counter or prescription medications.  If there is a problem please reply  once you have received a response from your provider.  Your safety is important to us .  If you have drug allergies check your prescription carefully.    You can use MyChart to ask questions about today's visit, request a non-urgent call back, or ask for a work or school excuse for 24 hours related to this e-Visit. If it has been greater than 24 hours you will need to follow up with your provider, or enter a new e-Visit to address those concerns.  You will get an e-mail in the next two days asking about your experience.  I hope that your e-visit has been valuable and will speed your recovery. Thank you for using e-visits.  I have spent 5 minutes in review of e-visit questionnaire, review and updating patient chart, medical decision making and response to patient.   Damani Kelemen, FNP

## 2024-07-02 ENCOUNTER — Ambulatory Visit: Payer: PRIVATE HEALTH INSURANCE | Admitting: Orthopedic Surgery
